# Patient Record
Sex: Male | Born: 1960
Health system: Southern US, Community
[De-identification: ages and names within clinical notes are randomized; demographics above are authoritative.]

## PROBLEM LIST (undated history)

## (undated) DIAGNOSIS — I10 Essential (primary) hypertension: Secondary | ICD-10-CM

## (undated) DIAGNOSIS — E119 Type 2 diabetes mellitus without complications: Principal | ICD-10-CM

## (undated) DIAGNOSIS — E785 Hyperlipidemia, unspecified: Secondary | ICD-10-CM

## (undated) HISTORY — PX: LEG SURGERY: SHX1003

## (undated) HISTORY — DX: Hyperlipidemia, unspecified: E78.5

## (undated) HISTORY — DX: Type 2 diabetes mellitus without complications: E11.9

## (undated) HISTORY — DX: Essential (primary) hypertension: I10

---

## 1999-12-30 ENCOUNTER — Encounter (HOSPITAL_BASED_OUTPATIENT_CLINIC_OR_DEPARTMENT_OTHER): Payer: Self-pay | Admitting: Internal Medicine

## 1999-12-30 ENCOUNTER — Ambulatory Visit (HOSPITAL_COMMUNITY): Admission: RE | Admit: 1999-12-30 | Discharge: 1999-12-30 | Payer: Self-pay | Admitting: Internal Medicine

## 2000-02-14 HISTORY — PX: INGUINAL HERNIA REPAIR: SUR1180

## 2001-04-08 ENCOUNTER — Emergency Department (HOSPITAL_COMMUNITY): Admission: EM | Admit: 2001-04-08 | Discharge: 2001-04-08 | Payer: Self-pay | Admitting: Emergency Medicine

## 2002-04-15 ENCOUNTER — Ambulatory Visit (HOSPITAL_BASED_OUTPATIENT_CLINIC_OR_DEPARTMENT_OTHER): Admission: RE | Admit: 2002-04-15 | Discharge: 2002-04-15 | Payer: Self-pay | Admitting: General Surgery

## 2003-06-08 ENCOUNTER — Encounter: Payer: Self-pay | Admitting: Internal Medicine

## 2004-02-22 ENCOUNTER — Ambulatory Visit: Payer: Self-pay | Admitting: Internal Medicine

## 2004-03-07 ENCOUNTER — Encounter: Admission: RE | Admit: 2004-03-07 | Discharge: 2004-03-07 | Payer: Self-pay | Admitting: Gastroenterology

## 2004-06-27 ENCOUNTER — Ambulatory Visit: Payer: Self-pay | Admitting: Internal Medicine

## 2004-07-04 ENCOUNTER — Encounter: Admission: RE | Admit: 2004-07-04 | Discharge: 2004-07-04 | Payer: Self-pay | Admitting: Internal Medicine

## 2005-02-20 ENCOUNTER — Ambulatory Visit: Payer: Self-pay | Admitting: Internal Medicine

## 2005-02-24 ENCOUNTER — Ambulatory Visit: Payer: Self-pay

## 2005-02-24 ENCOUNTER — Ambulatory Visit: Payer: Self-pay | Admitting: Internal Medicine

## 2005-03-27 ENCOUNTER — Ambulatory Visit: Payer: Self-pay | Admitting: Internal Medicine

## 2006-05-21 ENCOUNTER — Ambulatory Visit: Payer: Self-pay | Admitting: Internal Medicine

## 2006-08-21 ENCOUNTER — Ambulatory Visit: Payer: Self-pay | Admitting: Internal Medicine

## 2006-08-21 ENCOUNTER — Telehealth (INDEPENDENT_AMBULATORY_CARE_PROVIDER_SITE_OTHER): Payer: Self-pay | Admitting: *Deleted

## 2006-08-24 ENCOUNTER — Encounter (INDEPENDENT_AMBULATORY_CARE_PROVIDER_SITE_OTHER): Payer: Self-pay | Admitting: *Deleted

## 2006-08-24 LAB — CONVERTED CEMR LAB
ALT: 38 units/L (ref 0–53)
Creatinine, Ser: 0.6 mg/dL (ref 0.4–1.5)
Creatinine,U: 140.5 mg/dL
Hgb A1c MFr Bld: 6.5 % — ABNORMAL HIGH (ref 4.6–6.0)

## 2006-09-24 ENCOUNTER — Encounter: Payer: Self-pay | Admitting: Internal Medicine

## 2006-12-31 ENCOUNTER — Telehealth (INDEPENDENT_AMBULATORY_CARE_PROVIDER_SITE_OTHER): Payer: Self-pay | Admitting: *Deleted

## 2007-02-12 ENCOUNTER — Ambulatory Visit: Payer: Self-pay | Admitting: Vascular Surgery

## 2007-02-12 ENCOUNTER — Encounter: Payer: Self-pay | Admitting: Internal Medicine

## 2007-02-12 ENCOUNTER — Ambulatory Visit (HOSPITAL_COMMUNITY): Admission: RE | Admit: 2007-02-12 | Discharge: 2007-02-12 | Payer: Self-pay | Admitting: Internal Medicine

## 2007-06-21 ENCOUNTER — Ambulatory Visit: Payer: Self-pay | Admitting: Internal Medicine

## 2007-06-21 DIAGNOSIS — I10 Essential (primary) hypertension: Secondary | ICD-10-CM

## 2007-06-21 HISTORY — DX: Essential (primary) hypertension: I10

## 2007-06-21 LAB — CONVERTED CEMR LAB: HDL goal, serum: 40 mg/dL

## 2007-09-26 ENCOUNTER — Telehealth (INDEPENDENT_AMBULATORY_CARE_PROVIDER_SITE_OTHER): Payer: Self-pay | Admitting: *Deleted

## 2007-10-03 ENCOUNTER — Ambulatory Visit: Payer: Self-pay | Admitting: Internal Medicine

## 2007-10-07 LAB — CONVERTED CEMR LAB
AST: 23 units/L (ref 0–37)
Albumin: 4.4 g/dL (ref 3.5–5.2)
Cholesterol: 165 mg/dL (ref 0–200)
Creatinine,U: 208.1 mg/dL
HDL: 31.3 mg/dL — ABNORMAL LOW (ref >39.0)
Hgb A1c MFr Bld: 6.7 % — ABNORMAL HIGH (ref 4.6–6.0)
Potassium: 4.3 meq/L (ref 3.5–5.1)
Total Protein: 7.9 g/dL (ref 6.0–8.3)
VLDL: 17 mg/dL (ref 0–40)

## 2007-10-11 ENCOUNTER — Ambulatory Visit: Payer: Self-pay | Admitting: Internal Medicine

## 2007-10-11 DIAGNOSIS — E785 Hyperlipidemia, unspecified: Secondary | ICD-10-CM

## 2007-10-11 HISTORY — DX: Hyperlipidemia, unspecified: E78.5

## 2008-10-23 ENCOUNTER — Ambulatory Visit: Payer: Self-pay | Admitting: Internal Medicine

## 2008-10-25 LAB — CONVERTED CEMR LAB
ALT: 48 units/L (ref 0–53)
AST: 24 units/L (ref 0–37)
BUN: 10 mg/dL (ref 6–23)
Bilirubin, Direct: 0 mg/dL (ref 0.0–0.3)
HDL: 32.9 mg/dL — ABNORMAL LOW (ref >39.00)
Microalb Creat Ratio: 2.5 mg/g (ref 0.0–30.0)
Microalb, Ur: 0.5 mg/dL (ref 0.0–1.9)
Potassium: 5.1 meq/L (ref 3.5–5.1)
Total Bilirubin: 0.8 mg/dL (ref 0.3–1.2)
Total CHOL/HDL Ratio: 4

## 2008-10-29 ENCOUNTER — Ambulatory Visit: Payer: Self-pay | Admitting: Internal Medicine

## 2008-10-29 DIAGNOSIS — R9431 Abnormal electrocardiogram [ECG] [EKG]: Secondary | ICD-10-CM | POA: Insufficient documentation

## 2008-10-29 DIAGNOSIS — N400 Enlarged prostate without lower urinary tract symptoms: Secondary | ICD-10-CM | POA: Insufficient documentation

## 2009-02-22 ENCOUNTER — Ambulatory Visit: Payer: Self-pay | Admitting: Internal Medicine

## 2009-02-23 LAB — CONVERTED CEMR LAB
Lymphocytes Relative: 36.7 % (ref 12.0–46.0)
Lymphs Abs: 1.9 10*3/uL (ref 0.7–4.0)
MCHC: 33 g/dL (ref 30.0–36.0)
Monocytes Relative: 11 % (ref 3.0–12.0)
Neutrophils Relative %: 49.8 % (ref 43.0–77.0)
PSA: 0.68 ng/mL (ref 0.10–4.00)
RBC: 4.46 M/uL (ref 4.22–5.81)
RDW: 12.9 % (ref 11.5–14.6)

## 2009-02-24 ENCOUNTER — Encounter (INDEPENDENT_AMBULATORY_CARE_PROVIDER_SITE_OTHER): Payer: Self-pay | Admitting: *Deleted

## 2009-03-01 ENCOUNTER — Ambulatory Visit: Payer: Self-pay | Admitting: Internal Medicine

## 2009-03-01 DIAGNOSIS — E119 Type 2 diabetes mellitus without complications: Secondary | ICD-10-CM | POA: Insufficient documentation

## 2009-03-01 DIAGNOSIS — D649 Anemia, unspecified: Secondary | ICD-10-CM | POA: Insufficient documentation

## 2009-03-01 HISTORY — DX: Type 2 diabetes mellitus without complications: E11.9

## 2009-04-01 ENCOUNTER — Encounter: Payer: Self-pay | Admitting: Internal Medicine

## 2009-05-11 ENCOUNTER — Telehealth (INDEPENDENT_AMBULATORY_CARE_PROVIDER_SITE_OTHER): Payer: Self-pay | Admitting: *Deleted

## 2009-07-21 ENCOUNTER — Ambulatory Visit: Payer: Self-pay | Admitting: Internal Medicine

## 2009-07-21 DIAGNOSIS — T783XXA Angioneurotic edema, initial encounter: Secondary | ICD-10-CM | POA: Insufficient documentation

## 2009-08-18 ENCOUNTER — Telehealth (INDEPENDENT_AMBULATORY_CARE_PROVIDER_SITE_OTHER): Payer: Self-pay | Admitting: *Deleted

## 2009-09-02 ENCOUNTER — Telehealth (INDEPENDENT_AMBULATORY_CARE_PROVIDER_SITE_OTHER): Payer: Self-pay | Admitting: *Deleted

## 2009-11-17 ENCOUNTER — Telehealth (INDEPENDENT_AMBULATORY_CARE_PROVIDER_SITE_OTHER): Payer: Self-pay | Admitting: *Deleted

## 2009-11-30 ENCOUNTER — Telehealth (INDEPENDENT_AMBULATORY_CARE_PROVIDER_SITE_OTHER): Payer: Self-pay | Admitting: *Deleted

## 2009-12-03 ENCOUNTER — Ambulatory Visit: Payer: Self-pay | Admitting: Internal Medicine

## 2010-01-04 ENCOUNTER — Telehealth (INDEPENDENT_AMBULATORY_CARE_PROVIDER_SITE_OTHER): Payer: Self-pay | Admitting: *Deleted

## 2010-01-10 ENCOUNTER — Telehealth (INDEPENDENT_AMBULATORY_CARE_PROVIDER_SITE_OTHER): Payer: Self-pay | Admitting: *Deleted

## 2010-03-02 ENCOUNTER — Encounter: Payer: Self-pay | Admitting: Internal Medicine

## 2010-03-03 ENCOUNTER — Ambulatory Visit
Admission: RE | Admit: 2010-03-03 | Discharge: 2010-03-03 | Payer: Self-pay | Source: Home / Self Care | Attending: Internal Medicine | Admitting: Internal Medicine

## 2010-03-03 ENCOUNTER — Other Ambulatory Visit: Payer: Self-pay | Admitting: Internal Medicine

## 2010-03-03 LAB — LIPID PANEL
Cholesterol: 113 mg/dL (ref 0–200)
HDL: 35.7 mg/dL — ABNORMAL LOW (ref >39.00)
LDL Cholesterol: 68 mg/dL (ref 0–99)
Total CHOL/HDL Ratio: 3
Triglycerides: 45 mg/dL (ref 0.0–149.0)
VLDL: 9 mg/dL (ref 0.0–40.0)

## 2010-03-03 LAB — BUN: BUN: 16 mg/dL (ref 6–23)

## 2010-03-03 LAB — MICROALBUMIN / CREATININE URINE RATIO
Creatinine,U: 85.1 mg/dL
Microalb Creat Ratio: 12.2 mg/g (ref 0.0–30.0)
Microalb, Ur: 10.4 mg/dL — ABNORMAL HIGH (ref 0.0–1.9)

## 2010-03-03 LAB — TSH: TSH: 0.45 u[IU]/mL (ref 0.35–5.50)

## 2010-03-03 LAB — HEPATIC FUNCTION PANEL
ALT: 39 U/L (ref 0–53)
AST: 26 U/L (ref 0–37)
Albumin: 4.7 g/dL (ref 3.5–5.2)
Alkaline Phosphatase: 43 U/L (ref 39–117)
Bilirubin, Direct: 0.1 mg/dL (ref 0.0–0.3)
Total Bilirubin: 0.7 mg/dL (ref 0.3–1.2)
Total Protein: 8 g/dL (ref 6.0–8.3)

## 2010-03-03 LAB — POTASSIUM: Potassium: 4.5 mEq/L (ref 3.5–5.1)

## 2010-03-03 LAB — CREATININE, SERUM: Creatinine, Ser: 0.7 mg/dL (ref 0.4–1.5)

## 2010-03-03 LAB — HEMOGLOBIN A1C: Hgb A1c MFr Bld: 6.6 % — ABNORMAL HIGH (ref 4.6–6.5)

## 2010-03-07 ENCOUNTER — Telehealth (INDEPENDENT_AMBULATORY_CARE_PROVIDER_SITE_OTHER): Payer: Self-pay | Admitting: *Deleted

## 2010-03-15 NOTE — Assessment & Plan Note (Signed)
Summary: bp is high/kn   Vital Signs:  Patient profile:   50 year old male Weight:      191.8 pounds BMI:     29.27 Temp:     98.4 degrees F oral Pulse rate:   72 / minute Resp:     15 per minute BP sitting:   134 / 90  (left arm) Cuff size:   large  Vitals Entered By: Shonna Chock CMA (December 03, 2009 2:50 PM) CC: B/P Concerns   CC:  B/P Concerns.  History of Present Illness: Hypertension Follow-Up      This is a 50 year old man who presents for Hypertension follow-up.  The patient reports minor am  headaches upon awakening , but denies lightheadedness, urinary frequency, and fatigue.  The patient denies the following associated symptoms: chest pain, chest pressure, exercise intolerance, dyspnea, palpitations, syncope, leg edema, and pedal edema.  Compliance with medications (by patient report) has been near 100%.  The patient reports that dietary compliance has been good.  Adjunctive measures currently used by the patient include salt restriction.  BP this am was "129/103"  Current Medications (verified): 1)  Hydrochlorothiazide 25 Mg Tabs (Hydrochlorothiazide) .... 1/2 By Mouth Once Daily 2)  Viagra   Tabs (Sildenafil Citrate Tabs) .... 100mg  Take 1po As Needed 3)  Metformin Hcl 500 Mg  Tabs (Metformin Hcl) .Marland Kitchen.. 1 By Mouth Two Times A Day With 2 Largest Meals 4)  Freestyle Lite Test   Strp (Glucose Blood) .... Use One Strip Daily 5)  Freestyle Lancets   Misc (Lancets) .... Use As Directed 6)  Crestor 20 Mg Tabs (Rosuvastatin Calcium) .... Take 1 Tab Once Daily **appointment Due** 7)  Amlodipine Besylate 5 Mg Tabs (Amlodipine Besylate) .Marland Kitchen.. 1 By Mouth Once Daily **appointment Due** 8)  Januvia 100 Mg Tabs (Sitagliptin Phosphate) .Marland Kitchen.. 1 Once Daily For Diabetes 9)  Ranitidine Hcl 150 Mg Tabs (Ranitidine Hcl) .Marland Kitchen.. 1 Two Times A Day Pre Meals Until Swelling Gone  Allergies: 1)  ! Benazepril Hcl (Benazepril Hcl)  Physical Exam  General:  well-nourished,in no acute distress;  alert,appropriate and cooperative throughout examination Eyes:  No corneal or conjunctival inflammation noted.Perrla. Funduscopic exam benign, without hemorrhages, exudates or papilledema.  Lungs:  Normal respiratory effort, chest expands symmetrically. Lungs are clear to auscultation, no crackles or wheezes. Heart:  Normal rate and regular rhythm. S1 and S2 normal without gallop, murmur, click, rub.S4. BP recheck : 138/ 98 Pulses:  R and L carotid,radial,dorsalis pedis and posterior tibial pulses are full and equal bilaterally Extremities:  No clubbing, cyanosis, edema.   Impression & Recommendations:  Problem # 1:  HYPERTENSION, ESSENTIAL NOS (ICD-401.9)  The following medications were removed from the medication list:    Losartan Potassium 100 Mg Tabs (Losartan potassium) .Marland Kitchen... 1 once daily(PMH of cheek swelling with Benazepril) His updated medication list for this problem includes:    Hydrochlorothiazide 25 Mg Tabs (Hydrochlorothiazide) .Marland Kitchen... 1/2 by mouth once daily    Amlodipine Besylate 5 Mg Tabs (Amlodipine besylate) .Marland Kitchen... 1 by mouth once daily    Carvedilol 3.125 Mg Tabs (Carvedilol) .Marland Kitchen... 1 two times a day ( losaratan faxed is not an option as  benazepril had caused angioedema)  Complete Medication List: 1)  Hydrochlorothiazide 25 Mg Tabs (Hydrochlorothiazide) .... 1/2 by mouth once daily 2)  Viagra Tabs (Sildenafil citrate tabs) .... 100mg  take 1po as needed 3)  Metformin Hcl 500 Mg Tabs (Metformin hcl) .Marland Kitchen.. 1 by mouth two times a day with 2  largest meals 4)  Freestyle Lite Test Strp (Glucose blood) .... Use one strip daily 5)  Freestyle Lancets Misc (Lancets) .... Use as directed 6)  Crestor 20 Mg Tabs (Rosuvastatin calcium) .... Take 1 tab once daily 7)  Amlodipine Besylate 5 Mg Tabs (Amlodipine besylate) .Marland Kitchen.. 1 by mouth once daily 8)  Januvia 100 Mg Tabs (Sitagliptin phosphate) .Marland Kitchen.. 1 once daily for diabetes 9)  Ranitidine Hcl 150 Mg Tabs (Ranitidine hcl) .Marland Kitchen.. 1 two times a  day pre meals until swelling gone 10)  Carvedilol 3.125 Mg Tabs (Carvedilol) .Marland Kitchen.. 1 two times a day ( losaratan faxed is not an option as  benazepril had caused angioedema)  Patient Instructions: 1)   YOU CAN NOT TAKE BP MEDS IN THE ARB OR ACE-I CLASSES BECAUSE OF SWELLING OF CHEEK ON BENAZEPRIL> Please schedule fasting labs: 2)  BUN,creat, K+ :401.9 3)  Hepatic Panel :995.20 4)  Lipid Panel :272.4 5)  TSH:272.4 6)  HbgA1C & Urine Microalbumin : 250.00 7)  Limit your Sodium (Salt) to less than 4 grams a day (slightly less than 1 teaspoon) to prevent fluid retention, swelling, or worsening or symptoms. 8)  Check your Blood Pressure regularly. If it is above: 135/85 ON AVERAGE  you should make an appointment. Prescriptions: CARVEDILOL 3.125 MG TABS (CARVEDILOL) 1 two times a day ( Losaratan Faxed is not an option as  Benazepril had caused angioedema)  #60 x 0   Entered and Authorized by:   Marga Melnick MD   Signed by:   Marga Melnick MD on 12/03/2009   Method used:   Faxed to ...       [facility]* (retail)       [address]       [address] Shipping/mailing       [place], [place]  [address]       Ph: [phone]       Fax: [phone]   RxID:   [id number] LOSARTAN POTASSIUM 100 MG TABS (LOSARTAN POTASSIUM) 1 once daily  #30 x 5   Entered and Authorized by:   Marga Melnick MD   Signed by:   Marga Melnick MD on 12/03/2009   Method used:   Faxed to ...       [facility]* (retail)       [address]       [address] Shipping/mailing       [place], [place]  [address]       Ph: [phone]       Fax: [phone]   RxID:   [id number] AMLODIPINE BESYLATE 5 MG TABS (AMLODIPINE BESYLATE) 1 by mouth once daily  #90 x 1   Entered and Authorized by:   Marga Melnick MD   Signed by:   Marga Melnick MD on 12/03/2009   Method used:   Faxed to ...       [facility]* (retail)       [address]       [address] Shipping/mailing       [place], [place]  [address]       Ph: [phone]       Fax: [phone]   RxID:   [id number]    Orders Added: 1)  Est. Patient Level III [[address]]  Appended Document: bp is high/kn I called [facility] and cancelled rx for Losartan per Dr.Hopper

## 2010-03-15 NOTE — Progress Notes (Signed)
SummaryAlma Newman REFILL  Phone Note Refill Request Message from:  Fax from Pharmacy on November 30, 2009 11:04 AM  Refills Requested: Medication #1:  JANUVIA 100 MG TABS 1 once daily for Diabetes   Last Refilled: 09/02/2009 [facility]   FA [phone]  Initial call taken by: Jerolyn Shin,  November 30, 2009 11:08 AM    Prescriptions: JANUVIA 100 MG TABS (SITAGLIPTIN PHOSPHATE) 1 once daily for Diabetes  #90 x 0   Entered by:   Shonna Chock CMA   Authorized by:   Marga Melnick MD   Signed by:   Shonna Chock CMA on 11/30/2009   Method used:   Electronically to        [facility]* (retail)       [address]       [address] [place] Shipping/mailing       [place], [place]  [address]       Ph: [phone]       Fax: [phone]   RxID:   [id number]

## 2010-03-15 NOTE — Letter (Signed)
Summary: Geralynn Rile  [facility]   Imported By: Lanelle Bal 04/06/2009 11:32:04  _____________________________________________________________________  External Attachment:    Type:   Image     Comment:   External Document

## 2010-03-15 NOTE — Progress Notes (Signed)
Summary: refill  Phone Note Refill Request Message from:  Fax from Pharmacy on November 17, 2009 4:46 PM  Refills Requested: Medication #1:  CRESTOR 20 MG TABS Take 1 tab once daily  Medication #2:  AMLODIPINE BESYLATE 5 MG TABS 1 qd [facility] - fax [phone]   Initial call taken by: Okey Regal Spring,  November 17, 2009 4:46 PM    New/Updated Medications: CRESTOR 20 MG TABS (ROSUVASTATIN CALCIUM) Take 1 tab once daily **APPOINTMENT DUE** AMLODIPINE BESYLATE 5 MG TABS (AMLODIPINE BESYLATE) 1 by mouth once daily **APPOINTMENT DUE** Prescriptions: AMLODIPINE BESYLATE 5 MG TABS (AMLODIPINE BESYLATE) 1 by mouth once daily **APPOINTMENT DUE**  #30 x 1   Entered by:   Shonna Chock CMA   Authorized by:   Marga Melnick MD   Signed by:   Shonna Chock CMA on 11/18/2009   Method used:   Electronically to        [facility]* (retail)       [address]       [address] [place] Shipping/mailing       [place], [place]  [address]       Ph: [phone]       Fax: [phone]   RxID:   [id number] CRESTOR 20 MG TABS (ROSUVASTATIN CALCIUM) Take 1 tab once daily **APPOINTMENT DUE**  #30 x 1   Entered by:   Shonna Chock CMA   Authorized by:   Marga Melnick MD   Signed by:   Shonna Chock CMA on 11/18/2009   Method used:   Electronically to        [facility]* (retail)       [address]       [address] [place] Shipping/mailing       [place], [place]  [address]       Ph: [phone]       Fax: [phone]   RxID:   [id number]

## 2010-03-15 NOTE — Assessment & Plan Note (Signed)
Summary: face swelling/cbs   Vital Signs:  Patient profile:   50 year old male Weight:      190.8 pounds Temp:     98.5 degrees F oral Pulse rate:   80 / minute Resp:     16 per minute BP sitting:   124 / 76  (left arm) Cuff size:   large  Vitals Entered By: Shonna Chock (July 21, 2009 3:21 PM) CC: Face swelling x couple days, no change in daily habits Comments REVIEWED MED LIST, PATIENT AGREED DOSE AND INSTRUCTION CORRECT    CC:  Face swelling x couple days and no change in daily habits.  History of Present Illness: R maxillary swelling X 8 days ;this was present upon arising. The swelling is unchanged ;  no rash ,skin  color or temperature change & no significant  pain. No signs/ symptoms of URI. Rx: none. He is on an ACE-I. He has no PMH of allergies.  Allergies (verified): 1)  ! Benazepril Hcl (Benazepril Hcl)  Review of Systems General:  Denies chills, fever, and sweats. Eyes:  Denies discharge, eye pain, and red eye. ENT:  Denies ear discharge, earache, nasal congestion, and sinus pressure; Minor discomfort R paranasal area. No frontal headache or purulence.Marland Kitchen Resp:  Denies cough, sputum productive, and wheezing; No PMH of asthma. Derm:  Denies flushing and itching. Allergy:  Denies hives or rash, itching eyes, and sneezing; No lip or tongue swelling.  Physical Exam  General:  well-nourished,in no acute distress; alert,appropriate and cooperative throughout examination Eyes:  No corneal or conjunctival inflammation noted. EOMI. Perrla.  Vision grossly normal. Ears:  External ear exam shows no significant lesions or deformities.  Otoscopic examination reveals clear canals, tympanic membranes are intact bilaterally without bulging, retraction, inflammation or discharge. Hearing is grossly normal bilaterally. Nose:  External nasal examination shows no deformity or inflammation. Nasal mucosa are pink and moist without lesions or exudates. Mouth:  Oral mucosa and oropharynx  without lesions or exudates.  Teeth in good repair. Oropharynx WNL  Neck:  No UAO with hyperventilation Lungs:  Normal respiratory effort, chest expands symmetrically. Lungs are clear to auscultation, no crackles or wheezes. Skin:  4X1.5 cm doughy edema of R paranasal fold Cervical Nodes:  No lymphadenopathy noted Axillary Nodes:  No palpable lymphadenopathy   Impression & Recommendations:  Problem # 1:  ANGIOEDEMA (ICD-995.1) probably due to Benazepril   Problem # 2:  HYPERTENSION, ESSENTIAL NOS (ICD-401.9) BP monitor essential  off Benazepril The following medications were removed from the medication list:    Benazepril Hcl 40 Mg Tabs (Benazepril hcl) .Marland Kitchen... 1 tab once daily, must have office visit for additional refills His updated medication list for this problem includes:    Hydrochlorothiazide 25 Mg Tabs (Hydrochlorothiazide) .Marland Kitchen... 1/2 by mouth once daily    Amlodipine Besylate 5 Mg Tabs (Amlodipine besylate) .Marland Kitchen... 1 qd  Complete Medication List: 1)  Hydrochlorothiazide 25 Mg Tabs (Hydrochlorothiazide) .... 1/2 by mouth once daily 2)  Viagra Tabs (Sildenafil citrate tabs) .... 100mg  take 1po as needed 3)  Metformin Hcl 500 Mg Tabs (Metformin hcl) .Marland Kitchen.. 1 by mouth two times a day with 2 largest meals 4)  Freestyle Lite Test Strp (Glucose blood) .... Use one strip daily 5)  Freestyle Lancets Misc (Lancets) .... Use as directed 6)  Simvastatin 40 Mg Tabs (Simvastatin) .Marland Kitchen.. 1 at bedtime-office visit and labs due 7)  Amlodipine Besylate 5 Mg Tabs (Amlodipine besylate) .Marland Kitchen.. 1 qd 8)  Januvia 100 Mg Tabs (  Sitagliptin phosphate) .Marland Kitchen.. 1 once daily for diabetes 9)  Fexofenadine Hcl 180 Mg Tabs (Fexofenadine hcl) .Marland Kitchen.. 1 once daily until swelling gone 10)  Ranitidine Hcl 150 Mg Tabs (Ranitidine hcl) .Marland Kitchen.. 1 two times a day pre meals until swelling gone  Patient Instructions: 1)  Check your Blood Pressure regularly. If it is above: 140/90 ON AVERAGE OFF Benazepril 40 mg  you should make an  appointment. Call if having facial pain, purulence  or fever. Prescriptions: RANITIDINE HCL 150 MG TABS (RANITIDINE HCL) 1 two times a day pre meals until swelling gone  #60 x 0   Entered and Authorized by:   Marga Melnick MD   Signed by:   Marga Melnick MD on 07/21/2009   Method used:   Faxed to ...       [facility]* (retail)       [address]       [address] Shipping/mailing       [place], [place]  [address]       Ph: [phone]       Fax: [phone]   RxID:   [id number] FEXOFENADINE HCL 180 MG TABS (FEXOFENADINE HCL) 1 once daily until swelling gone  #30 x 0   Entered and Authorized by:   Marga Melnick MD   Signed by:   Marga Melnick MD on 07/21/2009   Method used:   Faxed to ...       [facility]* (retail)       [address]       [address] Shipping/mailing       [place], [place]  [address]       Ph: [phone]       Fax: [phone]   RxID:   [id number]

## 2010-03-15 NOTE — Progress Notes (Signed)
Summary: refill carvedilol  Phone Note Refill Request Message from:  Fax from Pharmacy on January 04, 2010 3:06 PM  Refills Requested: Medication #1:  CARVEDILOL 3.125 MG TABS 1 two times a day ( Losaratan Faxed is not an option as  Benazepril had caused angioedema). [facility] - fax [phone]  Initial call taken by: Okey Regal Spring,  January 04, 2010 3:09 PM    Prescriptions: CARVEDILOL 3.125 MG TABS (CARVEDILOL) 1 two times a day ( Losaratan Faxed is not an option as  Benazepril had caused angioedema)  #60 x 5   Entered by:   Shonna Chock CMA   Authorized by:   Marga Melnick MD   Signed by:   Shonna Chock CMA on 01/04/2010   Method used:   Electronically to        [facility]* (retail)       [address]       [address] [place] Shipping/mailing       [place], [place]  [address]       Ph: [phone]       Fax: [phone]   RxID:   [id number]

## 2010-03-15 NOTE — Progress Notes (Signed)
Summary: refill  Phone Note Refill Request Message from:  Fax from Pharmacy on January 10, 2010 4:14 PM  Refills Requested: Medication #1:  METFORMIN HCL 500 MG  TABS 1 by mouth two times a day with 2 largest meals  Medication #2:  RANITIDINE HCL 150 MG TABS 1 two times a day pre meals until swelling gone  Medication #3:  CRESTOR 20 MG TABS Take 1 tab once daily  Medication #4:  JANUVIA 100 MG TABS 1 once daily for Diabetes [facility] -fax [phone]  Initial call taken by: Okey Regal Spring,  January 10, 2010 4:21 PM  Follow-up for Phone Call        tried calling no answer no machine per labs done in 02/22/09 was supposed to return in 6 months past due for labs will send in 30 days w/ note for patient to schedule labs..........Marland KitchenDoristine Devoid CMA  January 11, 2010 3:02 PM     New/Updated Medications: METFORMIN HCL 500 MG  TABS (METFORMIN HCL) 1 by mouth two times a day with 2 largest meals- DUE FOR FASTING LABS BEFORE FURTHER REFILLS CRESTOR 20 MG TABS (ROSUVASTATIN CALCIUM) Take 1 tab once daily- DUE FOR FASTING LABS JANUVIA 100 MG TABS (SITAGLIPTIN PHOSPHATE) 1 once daily for Diabetes- DUE FOR FASTING LABS RANITIDINE HCL 150 MG TABS (RANITIDINE HCL) 1 two times a day pre meals until swelling gone- Prescriptions: RANITIDINE HCL 150 MG TABS (RANITIDINE HCL) 1 two times a day pre meals until swelling gone-  #180 x 0   Entered by:   Doristine Devoid CMA   Authorized by:   Marga Melnick MD   Signed by:   Doristine Devoid CMA on 01/11/2010   Method used:   Electronically to        [facility]* (retail)       [address]       [address] [place] Shipping/mailing       [place], [place]  [address]       Ph: [phone]       Fax: [phone]   RxID:   [id number] JANUVIA 100 MG TABS (SITAGLIPTIN PHOSPHATE) 1 once daily for Diabetes- DUE FOR FASTING LABS  #30 x 0   Entered by:   Doristine Devoid CMA   Authorized by:   Marga Melnick MD   Signed by:   Doristine Devoid  CMA on 01/11/2010   Method used:   Electronically to        [facility]* (retail)       [address]       [address] [place] Shipping/mailing       [place], [place]  [address]       Ph: [phone]       Fax: [phone]   RxID:   [id number] CRESTOR 20 MG TABS (ROSUVASTATIN CALCIUM) Take 1 tab once daily- DUE FOR FASTING LABS  #30 x 0   Entered by:   Doristine Devoid CMA   Authorized by:   Marga Melnick MD   Signed by:   Doristine Devoid CMA on 01/11/2010   Method used:   Electronically to        [facility]* (retail)       [address]       [address] [place] Shipping/mailing       [place], [place]  [address]       Ph: [phone]       Fax: [phone]  RxID:   [id number] METFORMIN HCL 500 MG  TABS (METFORMIN HCL) 1 by mouth two times a day with 2 largest meals- DUE FOR FASTING LABS BEFORE FURTHER REFILLS  #60 x 0   Entered by:   Doristine Devoid CMA   Authorized by:   Marga Melnick MD   Signed by:   Doristine Devoid CMA on 01/11/2010   Method used:   Electronically to        [facility]* (retail)       [address]       [address] [place] Shipping/mailing       [place], [place]  [address]       Ph: [phone]       Fax: [phone]   RxID:   [id number]

## 2010-03-15 NOTE — Letter (Signed)
Summary: Results Follow up Letter  [facility]  [address] [place], [place] [address]   Phone: [phone]  Fax: [phone]    02/24/2009 MRN: [id number]  Todd Newman [address] [place], [place]  [address]  Dear [name],  The following are the results of your recent test(s):  Test         Result    Pap Smear:        Normal _____  Not Normal _____ Comments: ______________________________________________________ Cholesterol: LDL(Bad cholesterol):         Your goal is less than:         HDL (Good cholesterol):       Your goal is more than: Comments:  ______________________________________________________ Mammogram:        Normal _____  Not Normal _____ Comments:  ___________________________________________________________________ Hemoccult:        Normal _____  Not normal _______ Comments:    _____________________________________________________________________ Other Tests: Please see attached lab done on 02/22/2009    We routinely do not discuss normal results over the telephone.  If you desire a copy of the results, or you have any questions about this information we can discuss them at your next office visit.   Sincerely,

## 2010-03-15 NOTE — Progress Notes (Signed)
Summary: med interaction  Phone Note From Pharmacy   Summary of Call: pt currently receiving simvastatin 40 mg along with amlodipine 5 mg. please see following page concerning current guidelines/dosing with the concurrent use of theses. per dr hopper change to crestor 20 mg once daily. pharmacy notified.Marland KitchenMarland KitchenFelecia Deloach CMA  August 18, 2009 5:04 PM     New/Updated Medications: CRESTOR 20 MG TABS (ROSUVASTATIN CALCIUM) Take 1 tab once daily Prescriptions: CRESTOR 20 MG TABS (ROSUVASTATIN CALCIUM) Take 1 tab once daily  #90 x 0   Entered by:   Jeremy Johann CMA   Authorized by:   Marga Melnick MD   Signed by:   Jeremy Johann CMA on 08/18/2009   Method used:   Faxed to ...       [facility]* (retail)       [address]       [address] Shipping/mailing       [place], [place]  [address]       Ph: [phone]       Fax: [phone]   RxID:   [id number]

## 2010-03-15 NOTE — Progress Notes (Signed)
Summary: refill  Phone Note Refill Request Message from:  Fax from Pharmacy on May 11, 2009 4:11 PM  Refills Requested: Medication #1:  BENAZEPRIL HCL 40 MG  TABS 1 tab once daily [place] outpatient fax [phone]   Method Requested: Fax to Local Pharmacy Next Appointment Scheduled: no appt Initial call taken by: Barb Merino,  May 11, 2009 4:11 PM    Prescriptions: BENAZEPRIL HCL 40 MG  TABS (BENAZEPRIL HCL) 1 tab once daily, MUST HAVE OFFICE VISIT FOR ADDITIONAL REFILLS  #90 x 1   Entered by:   Shonna Chock   Authorized by:   Marga Melnick MD   Signed by:   Shonna Chock on 05/11/2009   Method used:   Electronically to        [facility]* (retail)       [address]       [address] [place] Shipping/mailing       [place], [place]  [address]       Ph: [phone]       Fax: [phone]   RxID:   [id number]

## 2010-03-15 NOTE — Assessment & Plan Note (Signed)
Summary: 4 mth fu/ns/kdc   Vital Signs:  Patient profile:   50 year old male Weight:      196.6 pounds Pulse rate:   76 / minute Resp:     15 per minute BP sitting:   130 / 88  (left arm) Cuff size:   large  Vitals Entered By: Shonna Chock (March 01, 2009 4:38 PM) CC: 4 month follow-up  Comments REVIEWED MED LIST, PATIENT AGREED DOSE AND INSTRUCTION CORRECT    CC:  4 month follow-up .  History of Present Illness: "Eating better" ; FBS 130s; 2 hrs post meal ? < 140. No hypoglycemia. Weight down ? 5#. No Ophth exam in > 12 months. A1c down from 6.8 to 6.4%. Labs reviewed & risks discussed. BP not monitored regularly.  Allergies (verified): No Known Drug Allergies  Review of Systems General:  Denies fatigue. Eyes:  Complains of blurring; denies double vision and vision loss-both eyes. CV:  Denies chest pain or discomfort, leg cramps with exertion, lightheadness, and near fainting. Derm:  Denies poor wound healing. Neuro:  Denies numbness and tingling. Endo:  Denies excessive hunger, excessive thirst, and excessive urination.  Physical Exam  General:  well-nourished,in no acute distress; alert,appropriate and cooperative throughout examination Lungs:  Normal respiratory effort, chest expands symmetrically.  Heart:  Normal rate and regular rhythm. S1 and S2 normal without gallop, murmur, click, rub. S4 Pulses:  R and L carotid,radial,dorsalis pedis  pulses are full and equal bilaterally. Decreased PTO Extremities:  No clubbing, cyanosis, edema. Pes planus. Good nail health Neurologic:  sensation intact to light touch over feet.   Skin:  Intact without suspicious lesions or rashes   Impression & Recommendations:  Problem # 1:  DIABETES MELLITUS, CONTROLLED (ICD-250.00)  His updated medication list for this problem includes:    Benazepril Hcl 40 Mg Tabs (Benazepril hcl) .Marland Kitchen... 1 tab once daily, must have office visit for additional refills    Metformin Hcl 500 Mg Tabs  (Metformin hcl) .Marland Kitchen... 1 by mouth two times a day with 2 largest meals    Januvia 100 Mg Tabs (Sitagliptin phosphate) .Marland Kitchen... 1 once daily for diabetes  Problem # 2:  ANEMIA (ICD-285.9) resolved   Complete Medication List: 1)  Hydrochlorothiazide 25 Mg Tabs (Hydrochlorothiazide) .... 1/2 by mouth once daily 2)  Viagra Tabs (Sildenafil citrate tabs) .... 100mg  take 1po as needed 3)  Benazepril Hcl 40 Mg Tabs (Benazepril hcl) .Marland Kitchen.. 1 tab once daily, must have office visit for additional refills 4)  Metformin Hcl 500 Mg Tabs (Metformin hcl) .Marland Kitchen.. 1 by mouth two times a day with 2 largest meals 5)  Freestyle Lite Test Strp (Glucose blood) .... Use one strip daily 6)  Freestyle Lancets Misc (Lancets) .... Use as directed 7)  Simvastatin 40 Mg Tabs (Simvastatin) .Marland Kitchen.. 1 at bedtime-office visit and labs due 8)  Amlodipine Besylate 5 Mg Tabs (Amlodipine besylate) .Marland Kitchen.. 1 qd 9)  Januvia 100 Mg Tabs (Sitagliptin phosphate) .Marland Kitchen.. 1 once daily for diabetes  Patient Instructions: 1)  Check your blood sugars regularly. If your readings are usually above :150 or below 90 you should contact our office. 2)  See your eye doctor yearly to check for diabetic eye damage. 3)  Check your feet each night for sore areas, calluses or signs of infection.Eat & drink LESS THAN 40 grams of sugar/ day from LABELED foods & drinks.Check your Blood Pressure regularly. If it is above:135/[address]  you should make an appointment. 4)  Please  schedule a follow-up appointment in 6-8  months. 5)  BUN,creat, K+ prior to visit, ICD-9: 6)  Hepatic Panel prior to visit, ICD-9: 7)  Lipid Panel prior to visit, ICD-9: 8)  HbgA1C prior to visit, ICD-9:

## 2010-03-15 NOTE — Progress Notes (Signed)
Summary: Refill Request  Phone Note Refill Request Call back at [phone] Message from:  Pharmacy on September 02, 2009 10:11 AM  Refills Requested: Medication #1:  JANUVIA 100 MG TABS 1 once daily for Diabetes   Dosage confirmed as above?Dosage Confirmed   Supply Requested: 1 month   Last Refilled: 08/02/2009  Medication #2:  RANITIDINE HCL 150 MG TABS 1 two times a day pre meals until swelling gone.   Dosage confirmed as above?Dosage Confirmed   Supply Requested: 1 month   Last Refilled: 07/22/2009  Medication #3:  METFORMIN HCL 500 MG  TABS 1 by mouth two times a day with 2 largest meals   Dosage confirmed as above?Dosage Confirmed   Supply Requested: 3 months   Last Refilled: 06/29/2009 [facility]  Next Appointment Scheduled: none Initial call taken by: Lavell Islam,  September 02, 2009 10:13 AM    Prescriptions: RANITIDINE HCL 150 MG TABS (RANITIDINE HCL) 1 two times a day pre meals until swelling gone  #180 x 0   Entered by:   Doristine Devoid CMA   Authorized by:   Marga Melnick MD   Signed by:   Doristine Devoid CMA on 09/02/2009   Method used:   Electronically to        [facility]* (retail)       [address]       [address] [employer]       [place], [place]  [address]       Ph: [phone]       Fax: [phone]   RxID:   [id number] JANUVIA 100 MG TABS (SITAGLIPTIN PHOSPHATE) 1 once daily for Diabetes  #90 x 0   Entered by:   Doristine Devoid CMA   Authorized by:   Marga Melnick MD   Signed by:   Doristine Devoid CMA on 09/02/2009   Method used:   Electronically to        [facility]* (retail)       [address]       [address] [place] [employer]       [place], [place]  [address]       Ph: [phone]       Fax: [phone]   RxID:   [id number] METFORMIN HCL 500 MG  TABS (METFORMIN HCL) 1 by mouth two times a day with 2 largest meals  #180 Tablet x 0   Entered by:   Doristine Devoid CMA  Authorized by:   Marga Melnick MD   Signed by:   Doristine Devoid CMA on 09/02/2009   Method used:   Electronically to        [facility]* (retail)       [address]       [address] [place] [employer]       [place], [place]  [address]       Ph: [phone]       Fax: [phone]   RxID:   [id number]

## 2010-03-17 NOTE — Progress Notes (Signed)
Summary: New Rx  Phone Note From Pharmacy   Caller: [facility] Summary of Call: Handwritten note:  Can we get a new prescription for the True Result test stripes and lancets? Patient can get the True Strips and lancets for zero copay at our pharmacy.   [phone]: [phone] f: [phone] Initial call taken by: Harold Barban,  March 07, 2010 9:36 AM  Follow-up for Phone Call        RX for januvia and glocometer faxed [phone] Follow-up by: Shonna Chock CMA,  March 08, 2010 9:27 AM    New/Updated Medications: JANUVIA 100 MG TABS (SITAGLIPTIN PHOSPHATE) 1 once daily for Diabetes TRUERESULT BLOOD GLUCOSE W/DEVICE KIT (BLOOD GLUCOSE MONITORING SUPPL) Check bloodsugar daily, DX:250.00 (Include Test Strips,Lancets, & Alcohol preps) Prescriptions: JANUVIA 100 MG TABS (SITAGLIPTIN PHOSPHATE) 1 once daily for Diabetes  #90 x 0   Entered by:   Shonna Chock CMA   Authorized by:   Marga Melnick MD   Signed by:   Shonna Chock CMA on 03/08/2010   Method used:   Print then Give to Patient   RxID:   [id number] TRUERESULT BLOOD GLUCOSE W/DEVICE KIT (BLOOD GLUCOSE MONITORING SUPPL) Check bloodsugar daily, DX:250.00 (Include Test Strips,Lancets, & Alcohol preps)  #70mo supp x 3   Entered by:   Shonna Chock CMA   Authorized by:   Marga Melnick MD   Signed by:   Shonna Chock CMA on 03/08/2010   Method used:   Print then Give to Patient   RxID:   [id number]

## 2010-03-18 ENCOUNTER — Telehealth (INDEPENDENT_AMBULATORY_CARE_PROVIDER_SITE_OTHER): Payer: Self-pay | Admitting: *Deleted

## 2010-03-23 NOTE — Letter (Signed)
Summary: [facility]  [facility]   Imported By: Lanelle Bal 03/15/2010 12:29:23  _____________________________________________________________________  External Attachment:    Type:   Image     Comment:   External Document

## 2010-03-23 NOTE — Progress Notes (Signed)
Summary: Refill Request  Phone Note Refill Request Call back at [phone] Message from:  Pharmacy on March 18, 2010 1:43 PM  Refills Requested: Medication #1:  METFORMIN HCL 500 MG  TABS 1 by mouth two times a day with 2 largest meals- DUE FOR FASTING LABS BEFORE FURTHER REFILLS   Dosage confirmed as above?Dosage Confirmed   Supply Requested: 60   Last Refilled: 02/15/2010 [facility]  Next Appointment Scheduled: none Initial call taken by: Harold Barban,  March 18, 2010 1:44 PM  Follow-up for Phone Call        Patient followed by: [facility] DM management program; Follow-up by: Shonna Chock CMA,  March 18, 2010 2:33 PM    New/Updated Medications: METFORMIN HCL 500 MG  TABS (METFORMIN HCL) 1 by mouth two times a day with 2 largest meals Prescriptions: METFORMIN HCL 500 MG  TABS (METFORMIN HCL) 1 by mouth two times a day with 2 largest meals  #60 x 0   Entered by:   Shonna Chock CMA   Authorized by:   Marga Melnick MD   Signed by:   Shonna Chock CMA on 03/18/2010   Method used:   Electronically to        [facility]* (retail)       [address]       [address] [employer] [employer]       [place], [place]  [address]       Ph: [phone]       Fax: [phone]   RxID:   [id number]

## 2010-03-29 ENCOUNTER — Encounter: Payer: Self-pay | Admitting: Internal Medicine

## 2010-03-30 ENCOUNTER — Telehealth (INDEPENDENT_AMBULATORY_CARE_PROVIDER_SITE_OTHER): Payer: Self-pay | Admitting: *Deleted

## 2010-04-04 ENCOUNTER — Encounter: Payer: Self-pay | Admitting: Internal Medicine

## 2010-04-04 ENCOUNTER — Encounter: Payer: Commercial Managed Care - PPO | Attending: Internal Medicine

## 2010-04-06 NOTE — Progress Notes (Signed)
Summary: refill  Phone Note Refill Request Message from:  Fax from Pharmacy on March 30, 2010 3:57 PM  Refills Requested: Medication #1:  VIAGRA   TABS 100MG  TAKE 1PO as needed [facility] - fax [phone]  Initial call taken by: Okey Regal Spring,  March 30, 2010 3:57 PM    Prescriptions: VIAGRA   TABS (SILDENAFIL CITRATE TABS) 100MG  TAKE 1PO as needed  #6 x 1   Entered by:   Shonna Chock CMA   Authorized by:   Marga Melnick MD   Signed by:   Shonna Chock CMA on 03/31/2010   Method used:   Faxed to ...       [facility]* (retail)       [address]       [place], [place]  [address]       Ph: [phone]       Fax: [phone]   RxID:   [id number]

## 2010-04-12 NOTE — Letter (Signed)
Summary: Note from Cranford Mon, RN  Note from Cranford Mon, RN   Imported By: Maryln Gottron 04/06/2010 11:13:12  _____________________________________________________________________  External Attachment:    Type:   Image     Comment:   External Document

## 2010-04-21 ENCOUNTER — Telehealth (INDEPENDENT_AMBULATORY_CARE_PROVIDER_SITE_OTHER): Payer: Self-pay | Admitting: *Deleted

## 2010-04-21 NOTE — Letter (Signed)
Summary: [facility]  [facility]   Imported By: Maryln Gottron 04/12/2010 12:54:48  _____________________________________________________________________  External Attachment:    Type:   Image     Comment:   External Document

## 2010-04-25 ENCOUNTER — Encounter: Payer: Self-pay | Admitting: Internal Medicine

## 2010-04-26 ENCOUNTER — Encounter: Payer: Commercial Managed Care - PPO | Attending: Internal Medicine

## 2010-05-03 NOTE — Progress Notes (Signed)
Summary: refill   Phone Note From Pharmacy Call back at [phone]   Summary of Call: steve from [facility] called regarding denial for metformin - he said they provide counseling & diet management - but they still need a prescriber for med - he said he called dr Holley Bouche but he doesnt prescribe -  Initial call taken by: Okey Regal Spring,  April 21, 2010 4:23 PM  Follow-up for Phone Call        Dr.Hopper please advise, Im not sure when patient is to follow-up with you to recheck a1c. Per Note from 03/29/2010 patient has no problem following up with you  Follow-up by: Shonna Chock CMA,  April 21, 2010 4:40 PM  Additional Follow-up for Phone Call Additional follow up Details #1::        refill Metformin  X 30 days; appt before next refill in 05/2010 with glucose  readings.He'll be seen every 6 months; lat seen 10/11 Additional Follow-up by: Marga Melnick MD,  April 21, 2010 5:11 PM    Additional Follow-up for Phone Call Additional follow up Details #2::    Left message with male to have patient return call when avaliable to schedule appointment for 05/2010 for a follow-up./[facility] CMA  April 25, 2010 1:11 PM   Prescriptions: METFORMIN HCL 500 MG  TABS (METFORMIN HCL) 1 by mouth two times a day with 2 largest meals  #60 x 0   Entered by:   Shonna Chock CMA   Authorized by:   Marga Melnick MD   Signed by:   Shonna Chock CMA on 04/22/2010   Method used:   Electronically to        [facility]* (retail)       [address]       [address] [place] [employer]       [place], [place]  [address]       Ph: [phone]       Fax: [phone]   RxID:   [id number]

## 2010-05-12 NOTE — Medication Information (Signed)
Summary: New RX Request for Blood Glucose Testing Strips  New RX Request for Blood Glucose Testing Strips   Imported By: Maryln Gottron 05/02/2010 10:32:28  _____________________________________________________________________  External Attachment:    Type:   Image     Comment:   External Document

## 2010-05-14 ENCOUNTER — Encounter: Payer: Self-pay | Admitting: Internal Medicine

## 2010-05-16 ENCOUNTER — Encounter: Payer: Self-pay | Admitting: Internal Medicine

## 2010-05-16 ENCOUNTER — Ambulatory Visit (INDEPENDENT_AMBULATORY_CARE_PROVIDER_SITE_OTHER): Payer: Commercial Managed Care - PPO | Admitting: Internal Medicine

## 2010-05-16 VITALS — BP 130/82 | HR 60 | Wt 183.4 lb

## 2010-05-16 DIAGNOSIS — E119 Type 2 diabetes mellitus without complications: Secondary | ICD-10-CM

## 2010-05-16 NOTE — Progress Notes (Signed)
  Subjective:    Patient ID: Todd Newman, male    DOB: 06-01-1960, 50 y.o.   MRN: [id number]  HPI  The following questions were asked concerning diabetes status. Have  you had excess thirst, excess hunger or excess urination. Have you had lightheadedness with standing, ,chest pain ; palpitations; or pain in  calves with walking.Are there any new ulcers or sores on the skin which are nonhealing especially over the feet. Has there  been a significant change in  weight. Are you  exercising. What is the value of your fasting sugar in the morning before eating ; what was the highest sugar 2 hours after a meal. Do you have numbness or tingling or burning in your feet.  He does go to the gym 3 times a week; he's lost 12 pounds.  He states that his morning sugars have ranged from 80-114. In reviewing the machine values as low as 77 were noted and a high of 150 on one occasion. Overall the numbers look quite good.        Review of Systems     Objective:   Physical Exam Heart:  Normal rate and regular rhythm. S1 and S2 normal without gallop, murmur, click, rub or other extra sounds.                                                                                                      Lungs:Chest clear to auscultation; no wheezes, rhonchi,rales ,or rubs present.No increased work of breathing. Diabetic Foot Check -  Appearance - no lesions, ulcers or calluses; good nail health Skin - no unusual pallor or redness Sensation - grossly intact to light touch Pulses: Left - Dorsalis Pedis and Posterior Tibia normal Right - Dorsalis Pedis and Posterior Tibia normal        Assessment & Plan:  #1 diabetes with adequate control; he is very focused and motivated  Plan: No change will be made in medications.; A1c will be monitored as per protocol(see patient Instructions)

## 2010-05-16 NOTE — Patient Instructions (Signed)
Diabetes Monitor   The A1c test checks the average amount of glucose (sugar) in the blood over the last 2 to 3 months.As glucose circulates in the blood, some of it binds to hemoglobin A. This is the main form of hemoglobin in adults. Hemoglobin is a red protein that carries oxygen in the red blood cells (RBC's). Once the glucose is bound to the hemoglobin A, it remains there for the life of the red blood cell (about 120 days). This combination of glucose and hemoglobin A is called A1c (or hemoglobin A1c or glycohemoglobin). Increased glucose in the blood, increases the hemoglobin A1c. A1c levels do not change quickly but will shift as older RBC's die and younger ones take their place.   The A1c test is used primarily to monitor the glucose control of diabetics over time. The goal of those with diabetes is to keep their blood glucose levels as close to normal as possible. This helps to minimize the complications caused by chronically elevated glucose levels, such as progressive damage to body organs like the kidneys, eyes, cardiovascular system, and nerves. The A1c test gives a picture of the average amount of glucose in the blood over the last few months. It can help a patient and his doctor know if the measures they are taking to control the patient's diabetes are successful or need to be adjusted.     Depending on the type of diabetes that you have, how well your diabetes is controlled, your A1c may be measured 2 to 4 times each year. When someone is first diagnosed with diabetes or if control is not good, A1c may be ordered more frequently.   NORMAL VALUES  Non diabetic adults: 5 %-6.1%  Good diabetic control: 6.2-6.4 %  Fair diabetic control: 6.5-7%  Poor diabetic control: greater than 7 % ( except with additional factors such as  advanced age; significant coronary or neurologic disease,etc). Check the A1c every 6 months if it is < 6.5%; every 4 months if  6.5% or higher. Goals for home glucose  monitoring are : fasting  or morning glucose goal of  90-150. Two hours after any meal , goal = < 180, preferably < 150.       

## 2010-05-24 ENCOUNTER — Other Ambulatory Visit: Payer: Self-pay | Admitting: Internal Medicine

## 2010-07-01 NOTE — Op Note (Signed)
Todd Newman, Todd Newman                         ACCOUNT NO.:  [id number]   MEDICAL RECORD NO.:  [id number]                   PATIENT TYPE:  AMB   LOCATION:  DSC                                  FACILITY:  [facility]   PHYSICIAN:  Sharlet Salina T. Hoxworth, M.D.          DATE OF BIRTH:  1960-11-15   DATE OF PROCEDURE:  04/15/2002  DATE OF DISCHARGE:                                 OPERATIVE REPORT   PREOPERATIVE DIAGNOSIS:  Right inguinal hernia.   POSTOPERATIVE DIAGNOSIS:  Right inguinal hernia.   OPERATION PERFORMED:  Repair of right inguinal hernia.   SURGEON:  Lorne Skeens. Hoxworth, M.D.   ANESTHESIA:  General.   INDICATIONS FOR PROCEDURE:  The patient is a 50 year old Hispanic male who  presents with right groin pain and on exam has a right inguinal hernia.  Repair under general anesthesia using mesh has been recommended and  accepted.  The nature of the procedure, its indications and risks of  bleeding, infection and recurrence were discussed and understood.  He is now  brought to the operating room for this procedure.   DESCRIPTION OF PROCEDURE:  The patient was brought to the operating room and  placed in supine position on the operating table and general anesthesia  induced.  The right groin was sterilely prepped and draped.  He received  preoperative antibiotics.  An oblique incision was made in the right groin  and dissected carried down through the subcutaneous tissue.  The external  oblique was identified and divided along the lines of its fibers through the  external ring.  The cord was dissected off the floor of the pubic tubercle.  The ilioinguinal nerve was dissected free and protected.  The floor was  relatively intact.  There was a dilated internal ring and a good sized cord  lipoma extending through the internal ring.  This was dissected away from  cord structures, clamped, divided, excised and tied with 3-0 Vicryl.  The  cord was skeletonized.  There was no peritoneal  sac.  The dilated internal  ring was closed with a couple of 2-0 Prolene sutures.  A piece of Parietex  mesh was then trimmed to size to fit the floor of the inguinal canal with  tails going around the cord at the internal ring.  It was sutured initially  to the pubic tubercle and then to the inguinal ligament with running 2-0  Prolene.  Medially the mesh was sutured to the edge of the rectus sheath  with interrupted 2-0 Prolene.  The tails were then tacked together laterally  with interrupted 2-0 Prolene creating an internal ring snug to the finger  tip.  This appeared to provide nice broad coverage of the direct and  indirect spaces.  The soft tissue was infiltrated with Marcaine.  Complete  hemostasis was assured.  The cord and ilioinguinal nerve were returned to  their anatomic position.  The external oblique was closed over  this with  running 3-0 Vicryl.  Scarpa's fascia was closed with running 3-0 Vicryl and  the skin closed with running subcuticular 4-0 Monocryl and Steri-Strips.  Sponge, needle and instrument counts were correct.  A dry sterile dressing  was applied and the patient taken to recovery in good condition.                                                Lorne Skeens. Hoxworth, M.D.    Tory Emerald  D:  04/15/2002  T:  04/15/2002  Job:  [id number]

## 2010-07-07 ENCOUNTER — Other Ambulatory Visit: Payer: Self-pay | Admitting: Internal Medicine

## 2010-07-18 ENCOUNTER — Other Ambulatory Visit: Payer: Self-pay | Admitting: Internal Medicine

## 2010-07-20 ENCOUNTER — Other Ambulatory Visit: Payer: Self-pay | Admitting: Internal Medicine

## 2010-07-29 ENCOUNTER — Other Ambulatory Visit (INDEPENDENT_AMBULATORY_CARE_PROVIDER_SITE_OTHER): Payer: Commercial Managed Care - PPO

## 2010-07-29 DIAGNOSIS — E785 Hyperlipidemia, unspecified: Secondary | ICD-10-CM

## 2010-07-29 DIAGNOSIS — E119 Type 2 diabetes mellitus without complications: Secondary | ICD-10-CM

## 2010-07-29 DIAGNOSIS — I10 Essential (primary) hypertension: Secondary | ICD-10-CM

## 2010-07-29 LAB — BASIC METABOLIC PANEL
BUN: 13 mg/dL (ref 6–23)
Calcium: 9.4 mg/dL (ref 8.4–10.5)
Creatinine, Ser: 0.6 mg/dL (ref 0.4–1.5)
GFR: 191.93 mL/min (ref >60.00)
Glucose, Bld: 84 mg/dL (ref 70–99)
Sodium: 141 mEq/L (ref 135–145)

## 2010-07-29 LAB — TSH: TSH: 0.59 u[IU]/mL (ref 0.35–5.50)

## 2010-07-29 LAB — HEMOGLOBIN A1C: Hgb A1c MFr Bld: 6.4 % (ref 4.6–6.5)

## 2010-07-29 LAB — LIPID PANEL
Cholesterol: 111 mg/dL (ref 0–200)
HDL: 33.8 mg/dL — ABNORMAL LOW (ref >39.00)
Triglycerides: 108 mg/dL (ref 0.0–149.0)
VLDL: 21.6 mg/dL (ref 0.0–40.0)

## 2010-07-29 NOTE — Progress Notes (Signed)
Labs only

## 2010-08-09 ENCOUNTER — Other Ambulatory Visit: Payer: Self-pay | Admitting: Internal Medicine

## 2010-08-09 NOTE — Telephone Encounter (Signed)
Please advise 

## 2010-08-09 NOTE — Telephone Encounter (Signed)
Refill sent.

## 2010-09-29 ENCOUNTER — Other Ambulatory Visit: Payer: Self-pay | Admitting: Internal Medicine

## 2010-09-29 NOTE — Telephone Encounter (Signed)
RX's sent to pharmacy 

## 2010-12-16 ENCOUNTER — Other Ambulatory Visit: Payer: Self-pay | Admitting: Internal Medicine

## 2010-12-16 NOTE — Telephone Encounter (Signed)
Done

## 2010-12-26 ENCOUNTER — Other Ambulatory Visit: Payer: Self-pay | Admitting: Internal Medicine

## 2011-02-21 ENCOUNTER — Other Ambulatory Visit: Payer: Self-pay | Admitting: Internal Medicine

## 2011-03-15 ENCOUNTER — Other Ambulatory Visit: Payer: Self-pay | Admitting: Internal Medicine

## 2011-03-22 ENCOUNTER — Other Ambulatory Visit: Payer: Self-pay | Admitting: Internal Medicine

## 2011-03-22 NOTE — Telephone Encounter (Signed)
A1c 250.00 

## 2011-03-26 ENCOUNTER — Encounter: Payer: Self-pay | Admitting: Internal Medicine

## 2011-04-11 ENCOUNTER — Telehealth: Payer: Self-pay | Admitting: Internal Medicine

## 2011-04-11 DIAGNOSIS — E119 Type 2 diabetes mellitus without complications: Secondary | ICD-10-CM

## 2011-04-11 MED ORDER — METFORMIN HCL 500 MG PO TABS: 500.0000 mg | ORAL_TABLET | Freq: Two times a day (BID) | ORAL | Status: DC

## 2011-04-11 NOTE — Telephone Encounter (Signed)
Refill: Metformin HCL 500 mg tablet. Take 1 tablet by mouth twice daily with meals.

## 2011-04-11 NOTE — Telephone Encounter (Signed)
I left message on cell phone informing patient he is due for A1C (Future order placed for elam) check as indicated on last refill

## 2011-04-13 ENCOUNTER — Other Ambulatory Visit (INDEPENDENT_AMBULATORY_CARE_PROVIDER_SITE_OTHER): Payer: Commercial Managed Care - PPO

## 2011-04-13 DIAGNOSIS — E119 Type 2 diabetes mellitus without complications: Secondary | ICD-10-CM

## 2011-04-13 LAB — HEMOGLOBIN A1C: Hgb A1c MFr Bld: 6.1 % (ref 4.6–6.5)

## 2011-04-19 ENCOUNTER — Telehealth: Payer: Self-pay

## 2011-04-19 NOTE — Telephone Encounter (Signed)
Left message on voicemail informing to call and schedule appointment prior to next refill on diabetic medication

## 2011-04-19 NOTE — Telephone Encounter (Signed)
Message copied by Maurice Small on Wed Apr 19, 2011  3:24 PM ------      Message from: Pecola Lawless      Created: Fri Apr 14, 2011  3:29 PM       A1c GOALS       Non diabetic adults: 5 %-6.1%       Good diabetic control: 6.2-6.4 %       Fair diabetic control: 6.5-7%       Poor diabetic control: greater than 7 % ( except with additional factors such as  advanced age; significant coronary or neurologic disease,etc). Check the A1c every 6 months as it is < 6.5%.Goals for home glucose monitoring are : fasting  or morning glucose goal of  90-150. Two hours after any meal , goal = < 180, preferably < 150.      Report any low blood glucoses immediately.      Please see me before refilling medications; they will need to be decreased or discontinued. [employer]

## 2011-04-25 ENCOUNTER — Ambulatory Visit (INDEPENDENT_AMBULATORY_CARE_PROVIDER_SITE_OTHER): Payer: Commercial Managed Care - PPO | Admitting: Internal Medicine

## 2011-04-25 ENCOUNTER — Encounter: Payer: Self-pay | Admitting: Internal Medicine

## 2011-04-25 DIAGNOSIS — E119 Type 2 diabetes mellitus without complications: Secondary | ICD-10-CM

## 2011-04-25 DIAGNOSIS — I10 Essential (primary) hypertension: Secondary | ICD-10-CM

## 2011-04-25 MED ORDER — CARVEDILOL 6.25 MG PO TABS: 6.2500 mg | ORAL_TABLET | Freq: Two times a day (BID) | ORAL | Status: DC

## 2011-04-25 MED ORDER — METFORMIN HCL 500 MG PO TABS: 500.0000 mg | ORAL_TABLET | Freq: Two times a day (BID) | ORAL | Status: DC

## 2011-04-25 MED ORDER — CARVEDILOL 3.125 MG PO TABS: 6.2500 mg | ORAL_TABLET | Freq: Two times a day (BID) | ORAL | Status: DC

## 2011-04-25 NOTE — Progress Notes (Signed)
Subjective:    Patient ID: Todd Newman, male    DOB: April 23, 1960, 51 y.o.   MRN: [id number]  HPI Diabetes status assessment: Fasting or morning glucose range:  89-113  Highest glucose 2 hours after any meal: < 130. Hypoglycemia :  no .                                                     Excess thirst ;  Excess hunger;  Excess urination:  no                                  Lightheadedness with standing:  no. Chest pain:  no ; Palpitations :no ;  Pain in  calves with walking:  no .                                                                                                                                 Non healing skin  ulcers or sores,especially over the feet:  no. Numbness or tingling or burning in feet : no .                                                                                                                                              Significant change in  Weight : down 19#. Vision changes :no  .                                                                    Exercise : job physical . Nutrition/diet:  Low carb. Medication compliance : yes. Medication adverse  Effects:  no . Eye exam : < 12 mos ago ; no retinopathy Foot care : no.  A1c/ urine microalbumin monitor:  6.1% (6.8% 9/10)      Review of Systems see BP; he has not monitored his blood pressure regular basis. He  is somewhat vague as to BP . See the cardiovascular review of systems above; he also denies edema or paroxysmal nocturnal dyspnea     Objective:   Physical Exam Gen.: Healthy  & well-nourished, appropriate and alert, weight Eyes: No lid/conjunctival changes, extraocular motion intact, fundi benign Neck: Normal  thyroid Respiratory: No increased work of breathing or abnormal breath sounds Cardiac : regular rhythm, no extra heart sounds, gallop, murmur.S 4 Abdomen: No organomegaly ,masses, bruits or aortic enlargement Lymph: No lymphadenopathy of the neck or axilla Skin: No rashes, lesions,  ulcers or ischemic changes Muscle skeletal: no nail changes Vasc:All pulses intact, no bruits present Neuro: Normal deep tendon reflexes, alert & oriented, sensation over feet Psych: judgment and insight, mood and affect normal         Assessment & Plan:

## 2011-04-25 NOTE — Patient Instructions (Addendum)
Blood Pressure Goal  Ideally is an AVERAGE < 135/85. This AVERAGE should be calculated from @ least 5-7 BP readings taken @ different times of day on different days of week. You should not respond to isolated BP readings , but rather the AVERAGE for that week  Please  schedule fasting Labs in 4 mos : BMET,Lipids, hepatic panel, A1c, urine microalbumin. PLEASE BRING THESE INSTRUCTIONS TO FOLLOW UP  LAB APPOINTMENT.This will guarantee correct labs are drawn, eliminating need for repeat blood sampling ( needle sticks ! ). Diagnoses /Codes: 401.9,250.00

## 2011-04-25 NOTE — Assessment & Plan Note (Signed)
It is uncertain whether blood pressure is adequately controlled. He is on very low-dose carvedilol. Because of its cardiac protection, dose will be increased. Blood pressure goals discussed

## 2011-04-25 NOTE — Assessment & Plan Note (Signed)
[name] is at the diabetes cutoff level at 6.1%. Januvia will be discontinued and A1c checked in 4 months with a lipid panel.

## 2011-04-26 ENCOUNTER — Telehealth: Payer: Self-pay | Admitting: Internal Medicine

## 2011-04-26 NOTE — Telephone Encounter (Signed)
Pharmacy sent fax to clarify qty on this RX  Carvedilol (Tab) COREG 6.25 MG Take 1 tablet (6.25 mg total) by mouth 2 (two) times daily with a meal  Should this be #120 for a 30-day supply  Please fax to [phone]

## 2011-04-26 NOTE — Telephone Encounter (Signed)
I called the pharmacy, spoke wit Arlys John. 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

## 2011-05-11 ENCOUNTER — Other Ambulatory Visit: Payer: Self-pay | Admitting: Internal Medicine

## 2011-05-12 ENCOUNTER — Other Ambulatory Visit: Payer: Self-pay | Admitting: Internal Medicine

## 2011-05-15 NOTE — Telephone Encounter (Signed)
Last filled 08-09-10 #6 1, last OV 04-25-11

## 2011-05-15 NOTE — Telephone Encounter (Signed)
Up to 10 or max allowed by Pharmacy

## 2011-05-15 NOTE — Telephone Encounter (Signed)
Rx sent 

## 2011-05-17 ENCOUNTER — Other Ambulatory Visit: Payer: Self-pay | Admitting: Internal Medicine

## 2011-06-06 ENCOUNTER — Telehealth: Payer: Self-pay | Admitting: Internal Medicine

## 2011-06-06 MED ORDER — GLUCOSE BLOOD VI STRP: ORAL_STRIP | Status: DC

## 2011-06-06 NOTE — Telephone Encounter (Signed)
Refill: Truetest glucose test strip. Test twice daily and as needed. Qty 200. Last fill 11-09-10

## 2011-06-06 NOTE — Telephone Encounter (Signed)
RX sent in to check blood sugar once daily for patient is NOT insulin dependant and insurance usually will NOT cover for patient to test more than once daily if NOT insulin dependant

## 2011-07-21 ENCOUNTER — Other Ambulatory Visit: Payer: Self-pay | Admitting: Internal Medicine

## 2011-07-21 NOTE — Telephone Encounter (Signed)
Lipid/Hep 272.4/995.20  

## 2011-08-22 ENCOUNTER — Other Ambulatory Visit: Payer: Self-pay | Admitting: Internal Medicine

## 2011-08-22 NOTE — Telephone Encounter (Signed)
Refill done.  

## 2011-09-19 ENCOUNTER — Other Ambulatory Visit: Payer: Self-pay | Admitting: Internal Medicine

## 2011-09-19 NOTE — Telephone Encounter (Signed)
A1C 250.00 

## 2011-09-26 ENCOUNTER — Other Ambulatory Visit: Payer: Self-pay | Admitting: Internal Medicine

## 2011-09-29 ENCOUNTER — Other Ambulatory Visit: Payer: Self-pay | Admitting: Internal Medicine

## 2011-10-02 ENCOUNTER — Other Ambulatory Visit: Payer: Self-pay | Admitting: Internal Medicine

## 2011-10-02 NOTE — Telephone Encounter (Signed)
Lipid/hep 272.5/995.20

## 2011-10-23 ENCOUNTER — Other Ambulatory Visit: Payer: Self-pay | Admitting: Internal Medicine

## 2011-10-25 ENCOUNTER — Encounter: Payer: Self-pay | Admitting: Internal Medicine

## 2011-10-25 ENCOUNTER — Ambulatory Visit (INDEPENDENT_AMBULATORY_CARE_PROVIDER_SITE_OTHER): Payer: Commercial Managed Care - PPO | Admitting: Internal Medicine

## 2011-10-25 VITALS — BP 124/80 | HR 66 | Temp 98.4°F | Wt 180.4 lb

## 2011-10-25 DIAGNOSIS — E119 Type 2 diabetes mellitus without complications: Secondary | ICD-10-CM

## 2011-10-25 NOTE — Progress Notes (Signed)
Subjective:    Patient ID: Todd Newman, male    DOB: [birth year], 51 y.o.   MRN: [id number]  HPI Diabetes status assessment: Fasting  glucose 30 average :  94  . Highest glucose 2 hours after any meal:  < 130. Hypoglycemia : occasionally as low as 74 .                                                                                                                    Exercise : works out @ gym 3X/ week; work  is physical . Nutrition/diet:  Avoids sweets. Medication compliance : yes. Medication adverse  Effects: no . Eye exam : Ophth seen in 2013. Foot care : no  A1c/ urine microalbumin monitor:  5.9%  Through [facility]       Review of Systems Excess thirst :no;  Excess hunger:  no ;  Excess urination:  no.                                  Lightheadedness with standing:  no. Chest pain:  no ; Palpitations :no ;  Pain in  calves with walking:  no .                                                                                                                                 Non healing skin  ulcers or sores,especially over the feet:  no. Numbness or tingling or burning in feet : no .                                                                                                                                              Significant change in  Weight : 12-14 # loss. Vision changes : no  .  Objective:   Physical Exam Gen.: Healthy and well-nourished in appearance. Alert, appropriate and cooperative throughout exam.  Eyes: No corneal or conjunctival inflammation noted.  Mouth: Oral mucosa and oropharynx reveal no lesions or exudates. Teeth in good repair.  Lungs: Normal respiratory effort; chest expands symmetrically. Lungs are clear to auscultation without rales, wheezes, or increased work of breathing. Heart: Normal rate and rhythm. Normal S1 and S2. No gallop, click, or rub. S4 with slurring at  LSB . Abdomen: Bowel sounds normal; abdomen soft and nontender. No masses,  organomegaly or hernias noted.                                                                               Musculoskeletal/extremities: No deformity or scoliosis noted of  the thoracic or lumbar spine No clubbing, cyanosis, edema, or deformity noted.  Nail health  good. Vascular: Carotid, radial artery, dorsalis pedis and  posterior tibial pulses are full and equal. No bruits present. Neurologic: Alert and oriented x3. Light touch normal over feet.          Skin: Intact without suspicious lesions or rashes. Lymph: No cervical, axillary, or inguinal lymphadenopathy present. Psych: Mood and affect are normal. Normally interactive                                                                                         Assessment & Plan:

## 2011-10-25 NOTE — Assessment & Plan Note (Signed)
His A1c is 5.9%, in the nondiabetic range. Metformin will be discontinued totally and A1c checked with urine microalbumin in 4 months.

## 2011-10-25 NOTE — Patient Instructions (Addendum)
Please  schedule fasting Labs in FOUR months : BMET,Lipids, hepatic panel, A1c, urine microalbumin,TSH.PLEASE BRING THESE INSTRUCTIONS TO JANET HAUSER, RN TO SCHEDULE FOLLOW UP  LABS.This will guarantee correct labs are drawn, eliminating need for repeat blood sampling ( needle sticks ! ). Diagnoses /Codes: 790.29, 272.4,995.20.   If you activate My Chart; the results can be released to you as soon as they populate from the lab. If you choose not to use this program; the labs have to be reviewed, copied & mailed   causing a delay in getting the results to you.

## 2011-10-30 ENCOUNTER — Other Ambulatory Visit: Payer: Self-pay | Admitting: Internal Medicine

## 2011-10-30 NOTE — Telephone Encounter (Signed)
Ranitidine 150 mg Qty:180  Last fill: 07/12/11 Take 1 tablet by mouth twice daily pre-meals until swelling gone

## 2011-10-30 NOTE — Telephone Encounter (Signed)
OK #60,R X 2 

## 2011-10-31 MED ORDER — RANITIDINE HCL 150 MG PO TABS: 150.0000 mg | ORAL_TABLET | Freq: Two times a day (BID) | ORAL | Status: DC

## 2011-10-31 NOTE — Telephone Encounter (Signed)
Rx sent to [facility] pharmacy. 

## 2011-11-06 ENCOUNTER — Other Ambulatory Visit: Payer: Self-pay | Admitting: Internal Medicine

## 2011-11-06 NOTE — Telephone Encounter (Signed)
Refill CRESTOR 20 MG TAKE 1 TABLET BY MOUTH ONCE DAILY # 30 last fill 8.19.13 Last ov 9.11.13 f/u dm, last labs 6.15.12, as per last visit pt was to follow up with fasting labs no future appt made

## 2011-11-07 MED ORDER — ROSUVASTATIN CALCIUM 20 MG PO TABS: ORAL_TABLET | ORAL | Status: DC

## 2011-11-14 ENCOUNTER — Other Ambulatory Visit (INDEPENDENT_AMBULATORY_CARE_PROVIDER_SITE_OTHER): Payer: Commercial Managed Care - PPO

## 2011-11-14 DIAGNOSIS — R7309 Other abnormal glucose: Secondary | ICD-10-CM

## 2011-11-14 DIAGNOSIS — T887XXA Unspecified adverse effect of drug or medicament, initial encounter: Secondary | ICD-10-CM

## 2011-11-14 DIAGNOSIS — E785 Hyperlipidemia, unspecified: Secondary | ICD-10-CM

## 2011-11-14 LAB — LIPID PANEL
HDL: 40.8 mg/dL (ref >39.00)
LDL Cholesterol: 73 mg/dL (ref 0–99)
Total CHOL/HDL Ratio: 3
Triglycerides: 73 mg/dL (ref 0.0–149.0)
VLDL: 14.6 mg/dL (ref 0.0–40.0)

## 2011-11-14 LAB — BASIC METABOLIC PANEL
CO2: 30 mEq/L (ref 19–32)
Chloride: 102 mEq/L (ref 96–112)
Sodium: 138 mEq/L (ref 135–145)

## 2011-11-14 LAB — HEPATIC FUNCTION PANEL: Albumin: 4.3 g/dL (ref 3.5–5.2)

## 2011-11-14 LAB — MICROALBUMIN / CREATININE URINE RATIO
Creatinine,U: 190.5 mg/dL
Microalb Creat Ratio: 0.5 mg/g (ref 0.0–30.0)

## 2011-11-14 LAB — TSH: TSH: 0.93 u[IU]/mL (ref 0.35–5.50)

## 2011-11-14 LAB — HEMOGLOBIN A1C: Hgb A1c MFr Bld: 6.1 % (ref 4.6–6.5)

## 2011-11-17 ENCOUNTER — Other Ambulatory Visit: Payer: Self-pay | Admitting: Internal Medicine

## 2011-12-14 ENCOUNTER — Other Ambulatory Visit: Payer: Self-pay | Admitting: Internal Medicine

## 2012-02-08 ENCOUNTER — Other Ambulatory Visit: Payer: Self-pay | Admitting: Internal Medicine

## 2012-02-12 ENCOUNTER — Other Ambulatory Visit: Payer: Self-pay | Admitting: Internal Medicine

## 2012-02-26 ENCOUNTER — Telehealth: Payer: Self-pay | Admitting: Internal Medicine

## 2012-02-26 ENCOUNTER — Other Ambulatory Visit (INDEPENDENT_AMBULATORY_CARE_PROVIDER_SITE_OTHER): Payer: Commercial Managed Care - PPO

## 2012-02-26 DIAGNOSIS — R7309 Other abnormal glucose: Secondary | ICD-10-CM

## 2012-02-26 DIAGNOSIS — T887XXA Unspecified adverse effect of drug or medicament, initial encounter: Secondary | ICD-10-CM

## 2012-02-26 DIAGNOSIS — E785 Hyperlipidemia, unspecified: Secondary | ICD-10-CM

## 2012-02-26 LAB — HEPATIC FUNCTION PANEL
ALT: 36 U/L (ref 0–53)
AST: 24 U/L (ref 0–37)
Albumin: 4.4 g/dL (ref 3.5–5.2)

## 2012-02-26 LAB — BASIC METABOLIC PANEL
BUN: 11 mg/dL (ref 6–23)
Chloride: 101 mEq/L (ref 96–112)
Creatinine, Ser: 0.6 mg/dL (ref 0.4–1.5)

## 2012-02-26 LAB — LIPID PANEL
Cholesterol: 106 mg/dL (ref 0–200)
VLDL: 9.2 mg/dL (ref 0.0–40.0)

## 2012-02-26 LAB — MICROALBUMIN / CREATININE URINE RATIO
Creatinine,U: 70.7 mg/dL
Microalb, Ur: 0.5 mg/dL (ref 0.0–1.9)

## 2012-02-26 LAB — HEMOGLOBIN A1C: Hgb A1c MFr Bld: 6.7 % — ABNORMAL HIGH (ref 4.6–6.5)

## 2012-02-26 NOTE — Telephone Encounter (Signed)
Pt went to [facility] at 7:30 this morning to have labs drawn, per [facility] will draw the following per last ov  Please schedule fasting Labs in FOUR months : BMET,Lipids, hepatic panel, A1c, urine microalbumin,TSH.PLEASE BRING THESE INSTRUCTIONS TO JANET HAUSER, RN TO SCHEDULE FOLLOW UP LABS.This will guarantee correct labs are drawn, eliminating need for repeat blood sampling ( needle sticks ! ). Diagnoses /Codes: 790.29, 272.4,995.20.  If you activate My Chart; the results can be released to you as soon as they populate from the lab. If you choose not to use this program; the labs have to be reviewed, copied & mailed causing a delay in getting the results to you.        Can you please enter orders for [facility] Thanks

## 2012-02-26 NOTE — Telephone Encounter (Signed)
Orders placed.

## 2012-03-04 ENCOUNTER — Ambulatory Visit (INDEPENDENT_AMBULATORY_CARE_PROVIDER_SITE_OTHER): Payer: Commercial Managed Care - PPO | Admitting: Internal Medicine

## 2012-03-04 ENCOUNTER — Encounter: Payer: Self-pay | Admitting: Internal Medicine

## 2012-03-04 VITALS — BP 126/84 | HR 69 | Wt 180.6 lb

## 2012-03-04 DIAGNOSIS — I10 Essential (primary) hypertension: Secondary | ICD-10-CM

## 2012-03-04 DIAGNOSIS — E119 Type 2 diabetes mellitus without complications: Secondary | ICD-10-CM

## 2012-03-04 DIAGNOSIS — E785 Hyperlipidemia, unspecified: Secondary | ICD-10-CM

## 2012-03-04 DIAGNOSIS — R748 Abnormal levels of other serum enzymes: Secondary | ICD-10-CM

## 2012-03-04 NOTE — Patient Instructions (Addendum)
Minimal Blood Pressure Goal= AVERAGE < 140/90;  Ideal is an AVERAGE < 135/85. This AVERAGE should be calculated from @ least 5-7 BP readings taken @ different times of day on different days of week. You should not respond to isolated BP readings , but rather the AVERAGE for that week .Please bring your  blood pressure cuff to office visits to verify that it is reliable.It  can also be checked against the blood pressure device at the pharmacy. Finger or wrist cuffs are not dependable; an arm cuff is.  Please schedule A1c  in 5-6 months .Code: 250.00 Cardiovascular exercise is recommended 30-45 minutes 3-4 times per week. If you're not exercising you should take 6-8 weeks to build up to this level.  Viagra may be cheaper through  [facility] [phone] (toll-free).

## 2012-03-04 NOTE — Assessment & Plan Note (Signed)
A1c of 6.7% indicates good diabetic control. He describes only rare hypoglycemia. There is no microscopic kidney disease from diabetes. No change is indicated; he should recheck the A1c in  5-6 months.

## 2012-03-04 NOTE — Assessment & Plan Note (Signed)
There is  minimal reduction in Alkaline Phosphatase.Dietary sources of  Alk Phos include whole grains & nuts. Alk Phos is important for optimal liver & bone health.   

## 2012-03-04 NOTE — Progress Notes (Signed)
Subjective:    Patient ID: Todd Newman, male    DOB: September 06, 1960, 52 y.o.   MRN: [id number]  HPI The patient is here for followup of diabetes, hyperlipidemia, and hypertension.  The most recent A1c 02/26/12 was 6.7 %   , which correlates to an average sugar of146  , and long-term risk of  34% . Fasting blood sugar ranges 102- 122 .  Highest two-hour postprandial glucose is <  130. Rare hypoglycemia of 77 in afternoon Last ophthalmologic examination < 12mos  revealed no retinopathy. No active podiatry assessment on record. Diet is low fat  . Exercise as walking ; job physically active .  The most recent lipids  1/13 reveal LDL 62  , HDL 35 , and triglycerides 46   . There is medical compliance with the statin.  Blood pressure range  or average is < 140/90  . There is medical compliance with antihypertensive medications  No  medication adverse effects suggested         Review of Systems Constitutional: No  significant weight change,excess fatigue Eyes: No  blurred vision, double vision, or loss of vision Cardiovascular: no chest pain, palpitations, racing, irregular rhythm,syncope, claudication, or edema  Respiratory: No exertional dyspnea, paroxysmal nocturnal dyspnea Musculoskeletal: No myalgias or muscle cramping  Dermatologic: No non healing  lesions ,change in color or temperature of skin Neurologic: No  limb weakness,  numbness or tingling Endocrine: No change in hair/skin/ nails, excessive thirst, excessive hunger,or  excessive urination      Objective:   Physical Exam Gen.: Healthy and well-nourished in appearance. Alert, appropriate and cooperative throughout exam.   Eyes: No corneal or conjunctival inflammation noted. Pupils equal round reactive to light and accommodation.   Vision grossly normal.  Nose: External nasal exam reveals no deformity or inflammation. Nasal mucosa are pink and moist. No lesions or exudates noted.  Mouth: Oral mucosa and oropharynx  reveal no lesions or exudates. Teeth in good repair. Neck: No deformities, masses, or tenderness noted. Thyroid normal. Lungs: Normal respiratory effort; chest expands symmetrically. Lungs are clear to auscultation without rales, wheezes, or increased work of breathing. Heart: Normal rate and rhythm. Normal S1 and S2. No gallop, click, or rub. S4 w/o murmur. Abdomen: Bowel sounds normal; abdomen soft and nontender. No masses, organomegaly or hernias noted.                              Musculoskeletal/extremities: No deformity or scoliosis noted of  the thoracic or lumbar spine. No clubbing, cyanosis, edema, or significant extremity  deformity noted. Range of motion normal .Tone & strength  normal.Joints normal . Nail health good except R great toe nail thickened. Able to lie down & sit up w/o help.  Vascular: Carotid, radial artery, dorsalis pedis and  posterior tibial pulses are full and equal. No bruits present. Neurologic: Alert and oriented x3. Deep tendon reflexes symmetrical and normal.  Light touch normal over feet.  Skin: Intact without suspicious lesions or rashes. Lymph: No cervical, axillary lymphadenopathy present. Psych: Mood and affect are normal. Normally interactive  Assessment & Plan:

## 2012-03-04 NOTE — Assessment & Plan Note (Signed)
Lipids are at goal except for mildly reduced HDL.Interventions to raise HDL or GOOD cholesterol include: exercising 30-45 minutes 3-4 X per week; including salmon & tuna in the diet;  & supplementing with Omega 3 fatty acids (Flax or Fish oil )  1-2 grams per day. The B vitamin Niacin also raises HDL but has a vasodilating effect which may cause flushing.  No change in medications indicated

## 2012-03-04 NOTE — Assessment & Plan Note (Signed)
Renal function is excellent. Blood pressure goals discussed. No change indicated in  medications

## 2012-03-15 ENCOUNTER — Other Ambulatory Visit: Payer: Self-pay | Admitting: Internal Medicine

## 2012-03-21 ENCOUNTER — Telehealth: Payer: Self-pay | Admitting: Internal Medicine

## 2012-03-21 MED ORDER — ATORVASTATIN CALCIUM 20 MG PO TABS: 20.0000 mg | ORAL_TABLET | Freq: Every day | ORAL | Status: DC

## 2012-03-21 NOTE — Telephone Encounter (Signed)
Hopp please advise  

## 2012-03-21 NOTE — Telephone Encounter (Signed)
Left message to call office to advise Pt of med change and to schedule labs. Rx sent

## 2012-03-21 NOTE — Telephone Encounter (Signed)
Generic Lipitor 20 mg # 90. Please  schedule fasting Labs in 10 weeks after change to generic Lipitor 20 mg  : CK,Lipids, hepatic panel. Codes: [phone]

## 2012-03-21 NOTE — Telephone Encounter (Signed)
medication change request due to cost -- pharmacy wants to change pt from crestor to any of the following simvastatin, atorvastatin, pravastatin or lovastatin--please review and send new rx if approved

## 2012-03-22 NOTE — Telephone Encounter (Signed)
Discussed with pt.  He will call back to schedule labs.

## 2012-04-09 ENCOUNTER — Other Ambulatory Visit: Payer: Self-pay | Admitting: Internal Medicine

## 2012-04-22 ENCOUNTER — Other Ambulatory Visit: Payer: Self-pay | Admitting: Internal Medicine

## 2012-06-13 ENCOUNTER — Other Ambulatory Visit: Payer: Self-pay | Admitting: Internal Medicine

## 2012-08-26 ENCOUNTER — Other Ambulatory Visit: Payer: Self-pay | Admitting: Internal Medicine

## 2012-10-02 ENCOUNTER — Other Ambulatory Visit: Payer: Self-pay | Admitting: Internal Medicine

## 2012-10-03 NOTE — Telephone Encounter (Signed)
Rx sent to the pharmacy by e-script.//AB/CMA 

## 2012-10-24 ENCOUNTER — Encounter: Payer: Self-pay | Admitting: Internal Medicine

## 2012-10-31 ENCOUNTER — Other Ambulatory Visit: Payer: Self-pay | Admitting: Internal Medicine

## 2012-11-05 ENCOUNTER — Telehealth: Payer: Self-pay | Admitting: Internal Medicine

## 2012-11-05 NOTE — Telephone Encounter (Signed)
Refill request for:   Carvedilol 6.25mg  Tablet Original Quantity: 60 Directions: Take one tablet by mouth 2 times a day with a meal. Original fill: 04/22/12 Last fill: 07/30/12 Last OV: 03/04/12

## 2012-11-06 NOTE — Telephone Encounter (Signed)
OK with 2 refills

## 2012-11-06 NOTE — Telephone Encounter (Signed)
Last OV 03-04-12 Med last filled on 04-09-12 #6 with 1 refill.

## 2012-11-06 NOTE — Telephone Encounter (Signed)
Med filled.  

## 2012-11-07 MED ORDER — CARVEDILOL 6.25 MG PO TABS: ORAL_TABLET | ORAL | Status: DC

## 2012-11-07 NOTE — Telephone Encounter (Signed)
Rx sent to the pharmacy by e-script.//AB/CMA 

## 2012-11-07 NOTE — Telephone Encounter (Signed)
OK X1 

## 2012-11-08 ENCOUNTER — Encounter: Payer: Self-pay | Admitting: Internal Medicine

## 2012-11-08 ENCOUNTER — Ambulatory Visit (INDEPENDENT_AMBULATORY_CARE_PROVIDER_SITE_OTHER): Payer: Commercial Managed Care - PPO | Admitting: Internal Medicine

## 2012-11-08 VITALS — BP 130/82 | HR 65 | Temp 98.9°F | Wt 182.8 lb

## 2012-11-08 DIAGNOSIS — E785 Hyperlipidemia, unspecified: Secondary | ICD-10-CM

## 2012-11-08 DIAGNOSIS — E119 Type 2 diabetes mellitus without complications: Secondary | ICD-10-CM

## 2012-11-08 DIAGNOSIS — I10 Essential (primary) hypertension: Secondary | ICD-10-CM

## 2012-11-08 MED ORDER — GLUCOSE BLOOD VI STRP: ORAL_STRIP | Status: DC

## 2012-11-08 MED ORDER — CARVEDILOL 6.25 MG PO TABS: ORAL_TABLET | ORAL | Status: DC

## 2012-11-08 NOTE — Assessment & Plan Note (Signed)
Blood pressure well controlled.BMET indicated

## 2012-11-08 NOTE — Patient Instructions (Addendum)
Please make an appointment to see Todd Mon, RN @ [facility]. Fasting labs are indicated. Please take all your actual pill bottles to that appointment.

## 2012-11-08 NOTE — Progress Notes (Signed)
Subjective:    Patient ID: Todd Newman, male    DOB: 22-Aug-1960, 52 y.o.   MRN: [id number]  HPI The patient is here for followup of diabetes hyperlipidemia and hypertension.  The most recent A1c was 6.7 % in 02/2012  , which correlates to an average sugar of  146 , and long-term risk of 34 % . Fasting blood sugar  not monitored ; he has no strips.  Highest two-hour postprandial glucose is  not monitored . No hypoglycemia reported Last ophthalmologic examination current; this  revealed no retinopathy. No active podiatry assessment on record. Diet instruction " from [place]". Exercise  3-4 X/ week as walking & gym for  15-20 minutes.  The most recent lipids 1/14  reveal LDL 62 ; HDL 35 ; and triglycerides  46  . There is medical compliance with the statin but he may be on TWO statins.  Blood pressure not recorded  . There is medical   with antihypertensive medications  No  medication adverse effects         Review of Systems Constitutional: No  significant weight change;  excess fatigue Eyes: No  blurred vision;  double vision ; loss of vision Cardiovascular: no chest pain ;palpitations; racing; irregular rhythm ;syncope; claudication ; edema  Respiratory: No exertional dyspnea;  paroxysmal nocturnal dyspnea Musculoskeletal: No myalgias or muscle cramping  Dermatologic: No non healing  Lesions; change in color or temperature of skin Neurologic: No  limb weakness;  numbness or tingling;  burning Endocrine: No change in hair, skin, nails. No excessive thirst; excessive hunger;  excessive urination       Objective:   Physical Exam Gen.: Healthy and well-nourished in appearance. Alert, appropriate and cooperative throughout exam.Appears younger than stated age  Head: Normocephalic without obvious abnormalities  Eyes: No corneal or conjunctival inflammation noted. Pupils equal round reactive to light and accommodation.  Nose: External nasal exam reveals no deformity or  inflammation. Nasal mucosa are pink and moist. No lesions or exudates noted.  Mouth: Oral mucosa and oropharynx reveal no lesions or exudates. Teeth in good repair. Neck: No deformities, masses, or tenderness noted.  Thyroid normal. Lungs: Normal respiratory effort; chest expands symmetrically. Lungs are clear to auscultation without rales, wheezes, or increased work of breathing. Heart: Normal rate and rhythm. Normal S1 and S2. No gallop, click, or rub. No murmur. Abdomen: Bowel sounds normal; abdomen soft and nontender. No masses, organomegaly or hernias noted.               Musculoskeletal/extremities: No deformity or scoliosis noted of  the thoracic or lumbar spine.  No clubbing, cyanosis, edema, or significant extremity  deformity noted. Range of motion normal .Tone & strength  Normal. Joints normal . Nail health good. Able to lie down & sit up w/o help. Negative SLR bilaterally Vascular: Carotid, radial artery, dorsalis pedis and  posterior tibial pulses are full and equal. No bruits present. Neurologic: Alert and oriented x3. Deep tendon reflexes symmetrical and normal.         Skin: Intact without suspicious lesions or rashes. Lymph: No cervical, axillary lymphadenopathy present. Psych: Mood and affect are normal. Normally interactive  Assessment & Plan:  See Current Assessment & Plan in Problem List under specific Diagnosis

## 2012-11-08 NOTE — Assessment & Plan Note (Signed)
A1c and urine microalbumin through [facility]

## 2012-11-08 NOTE — Assessment & Plan Note (Signed)
Verification needed as to whether he is taking both atorvastatin and Crestor which would be inappropriate. Fasting lipids, CK, hepatic panel indicated

## 2012-11-12 ENCOUNTER — Other Ambulatory Visit: Payer: Self-pay | Admitting: Internal Medicine

## 2012-11-12 ENCOUNTER — Telehealth: Payer: Self-pay | Admitting: *Deleted

## 2012-11-12 DIAGNOSIS — E119 Type 2 diabetes mellitus without complications: Secondary | ICD-10-CM

## 2012-11-12 DIAGNOSIS — I10 Essential (primary) hypertension: Secondary | ICD-10-CM

## 2012-11-12 DIAGNOSIS — E785 Hyperlipidemia, unspecified: Secondary | ICD-10-CM

## 2012-11-12 NOTE — Telephone Encounter (Signed)
Orders entered into the EMR for Todd Newman. Please verify with his pharmacy if he is taking TWO statins.

## 2012-11-12 NOTE — Telephone Encounter (Signed)
Janet's (RN from [employer]) phone number is [phone]

## 2012-11-12 NOTE — Telephone Encounter (Signed)
Elizebeth Koller, RN, called this afternoon and would like to know what fasting labs need to be drawn for patient? She stated that the labs would need to be ordered and needs to be drawn at [facility] since patient works at [employer]. Please advise as to which labs should be ordered.

## 2012-11-13 ENCOUNTER — Encounter: Payer: Self-pay | Admitting: Internal Medicine

## 2012-11-13 NOTE — Telephone Encounter (Signed)
He should continue the Crestor destroy the prescription for Lipitor.

## 2012-11-13 NOTE — Telephone Encounter (Signed)
Spoke with the pharmacy and they said he has an rx for the Lipitor that was called in in Feb, but he has not filled it.  The pt only has picked-up the Crestor, which he picked up last on 10-02-12 #90.//AB/CMA

## 2012-11-18 ENCOUNTER — Encounter: Payer: Self-pay | Admitting: *Deleted

## 2012-11-18 ENCOUNTER — Other Ambulatory Visit (INDEPENDENT_AMBULATORY_CARE_PROVIDER_SITE_OTHER): Payer: Commercial Managed Care - PPO

## 2012-11-18 DIAGNOSIS — I10 Essential (primary) hypertension: Secondary | ICD-10-CM

## 2012-11-18 DIAGNOSIS — E119 Type 2 diabetes mellitus without complications: Secondary | ICD-10-CM

## 2012-11-18 DIAGNOSIS — E785 Hyperlipidemia, unspecified: Secondary | ICD-10-CM

## 2012-11-18 LAB — BASIC METABOLIC PANEL
CO2: 30 mEq/L (ref 19–32)
Calcium: 9.3 mg/dL (ref 8.4–10.5)
Creatinine, Ser: 0.7 mg/dL (ref 0.4–1.5)
GFR: 155.6 mL/min (ref >60.00)
Sodium: 136 mEq/L (ref 135–145)

## 2012-11-18 LAB — LIPID PANEL
HDL: 42.1 mg/dL (ref >39.00)
LDL Cholesterol: 73 mg/dL (ref 0–99)
Total CHOL/HDL Ratio: 3
Triglycerides: 70 mg/dL (ref 0.0–149.0)
VLDL: 14 mg/dL (ref 0.0–40.0)

## 2012-11-18 LAB — HEPATIC FUNCTION PANEL
Alkaline Phosphatase: 36 U/L — ABNORMAL LOW (ref 39–117)
Bilirubin, Direct: 0 mg/dL (ref 0.0–0.3)
Total Bilirubin: 0.4 mg/dL (ref 0.3–1.2)

## 2012-11-18 NOTE — Progress Notes (Signed)
Letter mailed to patient.

## 2012-12-03 ENCOUNTER — Other Ambulatory Visit: Payer: Self-pay | Admitting: Internal Medicine

## 2012-12-03 NOTE — Telephone Encounter (Signed)
Carvedilol refill sent to pharmacy 

## 2012-12-17 ENCOUNTER — Other Ambulatory Visit: Payer: Self-pay | Admitting: *Deleted

## 2012-12-17 MED ORDER — HYDROCHLOROTHIAZIDE 25 MG PO TABS: ORAL_TABLET | ORAL | Status: DC

## 2012-12-17 NOTE — Telephone Encounter (Signed)
Hydrochlorothiazide refill sent to pharamcy

## 2013-04-01 ENCOUNTER — Telehealth: Payer: Self-pay | Admitting: Internal Medicine

## 2013-04-01 NOTE — Telephone Encounter (Signed)
Letter from [facility] review, patient likes to get established with me, please arrange an appointment at his earliest convenience

## 2013-04-02 NOTE — Telephone Encounter (Signed)
Pt scheduled 04/11/13

## 2013-04-09 ENCOUNTER — Ambulatory Visit: Payer: Commercial Managed Care - PPO | Admitting: Internal Medicine

## 2013-04-11 ENCOUNTER — Encounter: Payer: Self-pay | Admitting: Internal Medicine

## 2013-04-11 ENCOUNTER — Ambulatory Visit (INDEPENDENT_AMBULATORY_CARE_PROVIDER_SITE_OTHER): Payer: Commercial Managed Care - PPO | Admitting: Internal Medicine

## 2013-04-11 VITALS — BP 137/82 | HR 66 | Temp 98.2°F | Ht 68.2 in | Wt 183.0 lb

## 2013-04-11 DIAGNOSIS — E119 Type 2 diabetes mellitus without complications: Secondary | ICD-10-CM

## 2013-04-11 DIAGNOSIS — I10 Essential (primary) hypertension: Secondary | ICD-10-CM

## 2013-04-11 DIAGNOSIS — K219 Gastro-esophageal reflux disease without esophagitis: Secondary | ICD-10-CM

## 2013-04-11 DIAGNOSIS — E785 Hyperlipidemia, unspecified: Secondary | ICD-10-CM

## 2013-04-11 MED ORDER — GLUCOSE BLOOD VI STRP: ORAL_STRIP | Status: DC

## 2013-04-11 NOTE — Assessment & Plan Note (Addendum)
seems well-controlled, check a  CBC, Last BMP satisfactory
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
## 2013-04-11 NOTE — Patient Instructions (Signed)
Please go to [facility] next week and get your labs done  Next visit is for a physical exam in 3 months, fasting Please make an appointment

## 2013-04-11 NOTE — Assessment & Plan Note (Signed)
On diet only, labs 

## 2013-04-11 NOTE — Progress Notes (Signed)
Pre visit review using our clinic review tool, if applicable. No additional management support is needed unless otherwise documented below in the visit note. 

## 2013-04-11 NOTE — Assessment & Plan Note (Signed)
Not taking any medications in few months, recent? Patient is not sure why. Plan: Labs next week, orders entered

## 2013-04-14 ENCOUNTER — Other Ambulatory Visit: Payer: Self-pay | Admitting: Internal Medicine

## 2013-04-15 ENCOUNTER — Telehealth: Payer: Self-pay

## 2013-04-15 ENCOUNTER — Other Ambulatory Visit (INDEPENDENT_AMBULATORY_CARE_PROVIDER_SITE_OTHER): Payer: Commercial Managed Care - PPO

## 2013-04-15 DIAGNOSIS — I10 Essential (primary) hypertension: Secondary | ICD-10-CM

## 2013-04-15 DIAGNOSIS — E119 Type 2 diabetes mellitus without complications: Secondary | ICD-10-CM

## 2013-04-15 DIAGNOSIS — E785 Hyperlipidemia, unspecified: Secondary | ICD-10-CM

## 2013-04-15 LAB — LIPID PANEL
CHOL/HDL RATIO: 5
CHOLESTEROL: 206 mg/dL — AB (ref 0–200)
HDL: 44.7 mg/dL (ref >39.00)
LDL Cholesterol: 138 mg/dL — ABNORMAL HIGH (ref 0–99)
Triglycerides: 117 mg/dL (ref 0.0–149.0)
VLDL: 23.4 mg/dL (ref 0.0–40.0)

## 2013-04-15 LAB — CBC WITH DIFFERENTIAL/PLATELET
BASOS PCT: 0.6 % (ref 0.0–3.0)
Basophils Absolute: 0 10*3/uL (ref 0.0–0.1)
Eosinophils Absolute: 0.2 10*3/uL (ref 0.0–0.7)
Eosinophils Relative: 4.1 % (ref 0.0–5.0)
HEMATOCRIT: 42 % (ref 39.0–52.0)
Hemoglobin: 14.2 g/dL (ref 13.0–17.0)
LYMPHS ABS: 1.6 10*3/uL (ref 0.7–4.0)
Lymphocytes Relative: 31 % (ref 12.0–46.0)
MCHC: 33.8 g/dL (ref 30.0–36.0)
MCV: 90.2 fl (ref 78.0–100.0)
MONO ABS: 0.6 10*3/uL (ref 0.1–1.0)
Monocytes Relative: 11.4 % (ref 3.0–12.0)
NEUTROS ABS: 2.8 10*3/uL (ref 1.4–7.7)
NEUTROS PCT: 52.9 % (ref 43.0–77.0)
Platelets: 343 10*3/uL (ref 150.0–400.0)
RBC: 4.66 Mil/uL (ref 4.22–5.81)
RDW: 14.4 % (ref 11.5–14.6)
WBC: 5.2 10*3/uL (ref 4.5–10.5)

## 2013-04-15 LAB — HEMOGLOBIN A1C: Hgb A1c MFr Bld: 6.3 % (ref 4.6–6.5)

## 2013-04-15 LAB — AST: AST: 23 U/L (ref 0–37)

## 2013-04-15 LAB — ALT: ALT: 39 U/L (ref 0–53)

## 2013-04-15 NOTE — Telephone Encounter (Signed)
Relevant patient education mailed to patient.  

## 2013-04-24 ENCOUNTER — Telehealth: Payer: Self-pay | Admitting: *Deleted

## 2013-04-24 NOTE — Telephone Encounter (Signed)
Received a message from Kelli Churn with [facility] to make [place] aware that pt is unable to afford the $75.00 copay for Crestor but could receive simvastatin, lovastatin or atorvastatin at no cost to the patient if you feel any of these medications are appropriate

## 2013-04-25 MED ORDER — ATORVASTATIN CALCIUM 20 MG PO TABS: 20.0000 mg | ORAL_TABLET | Freq: Every day | ORAL | Status: DC

## 2013-04-25 NOTE — Addendum Note (Signed)
Addended by: Colon Branch on: 04/25/2013 08:42 AM   Modules accepted: Orders

## 2013-04-25 NOTE — Telephone Encounter (Signed)
Called the patient at home and spoke with his daughter. Advised that Dr. Larose Kells has sent in a medication to treat pts high cholesterol, he is to take 1 tablet every night at bedtime. He also needs to repeat his labwork in 6 weeks. Patients daughter was able to repeat instructions to me.

## 2013-04-25 NOTE — Telephone Encounter (Signed)
Advise patient, I sent a prescription for Lipitor 20 mg, take one tablet at night. Arrange for FLP, AST, ALT in 6 weeks, DX hyperlipidemia

## 2013-04-28 NOTE — Telephone Encounter (Signed)
Spoke with patient and made aware of labs and that he needs to start Lipitor. He verbalized understanding of this and will go to the pharmacy to pick up medication. Patient stated he would call the office back to schedule follow up labs.

## 2013-06-25 ENCOUNTER — Other Ambulatory Visit: Payer: Self-pay

## 2013-06-25 MED ORDER — CARVEDILOL 6.25 MG PO TABS: ORAL_TABLET | ORAL | Status: DC

## 2013-09-10 ENCOUNTER — Other Ambulatory Visit: Payer: Self-pay | Admitting: *Deleted

## 2013-09-10 MED ORDER — ATORVASTATIN CALCIUM 20 MG PO TABS: 20.0000 mg | ORAL_TABLET | Freq: Every day | ORAL | Status: DC

## 2013-10-13 ENCOUNTER — Telehealth: Payer: Self-pay

## 2013-10-13 NOTE — Telephone Encounter (Signed)
Attempted to contact pt to schedule a follow up appt with Dr. Linna Darner.   All numbers listed were invalid or mailbox was full.   Sending letter regarding diabetes maintenance.   DM bundle pt.

## 2013-10-16 ENCOUNTER — Other Ambulatory Visit: Payer: Self-pay

## 2013-10-16 MED ORDER — RANITIDINE HCL 150 MG PO TABS: ORAL_TABLET | ORAL | Status: DC

## 2013-10-24 ENCOUNTER — Ambulatory Visit (INDEPENDENT_AMBULATORY_CARE_PROVIDER_SITE_OTHER): Payer: Commercial Managed Care - PPO | Admitting: Internal Medicine

## 2013-10-24 ENCOUNTER — Encounter: Payer: Self-pay | Admitting: Internal Medicine

## 2013-10-24 VITALS — BP 134/80 | HR 66 | Temp 98.5°F | Wt 186.5 lb

## 2013-10-24 DIAGNOSIS — I10 Essential (primary) hypertension: Secondary | ICD-10-CM

## 2013-10-24 DIAGNOSIS — E119 Type 2 diabetes mellitus without complications: Secondary | ICD-10-CM

## 2013-10-24 DIAGNOSIS — E785 Hyperlipidemia, unspecified: Secondary | ICD-10-CM

## 2013-10-24 NOTE — Progress Notes (Signed)
Pre visit review using our clinic review tool, if applicable. No additional management support is needed unless otherwise documented below in the visit note. 
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
## 2013-10-24 NOTE — Assessment & Plan Note (Signed)
well-controlled, due for labs, see instructions

## 2013-10-24 NOTE — Assessment & Plan Note (Signed)
Feet exam negative today Reports saw the eye M.D. Early this year and got good reports

## 2013-10-24 NOTE — Assessment & Plan Note (Signed)
Started atorvastatin March 2015, no apparent side effects, due for labs. Labs, see instructions

## 2013-10-24 NOTE — Patient Instructions (Addendum)
Stop by the front desk and schedule labs to be done within few days (fasting) CMP  A1C --- dx DM FLP --- dx High cholesterol   Please come back to the office in 3 months  for a  physical exam. Come back fasting    Stop by the front desk and schedule the visit

## 2013-10-28 ENCOUNTER — Other Ambulatory Visit (INDEPENDENT_AMBULATORY_CARE_PROVIDER_SITE_OTHER): Payer: Commercial Managed Care - PPO

## 2013-10-28 DIAGNOSIS — E1110 Type 2 diabetes mellitus with ketoacidosis without coma: Secondary | ICD-10-CM

## 2013-10-28 DIAGNOSIS — E1310 Other specified diabetes mellitus with ketoacidosis without coma: Secondary | ICD-10-CM

## 2013-10-28 DIAGNOSIS — E7800 Pure hypercholesterolemia, unspecified: Secondary | ICD-10-CM

## 2013-10-28 LAB — COMPREHENSIVE METABOLIC PANEL
ALK PHOS: 35 U/L — AB (ref 39–117)
ALT: 45 U/L (ref 0–53)
AST: 24 U/L (ref 0–37)
Albumin: 4.6 g/dL (ref 3.5–5.2)
BUN: 16 mg/dL (ref 6–23)
CO2: 28 mEq/L (ref 19–32)
Calcium: 9.5 mg/dL (ref 8.4–10.5)
Chloride: 101 mEq/L (ref 96–112)
Creatinine, Ser: 0.6 mg/dL (ref 0.4–1.5)
GFR: 169.08 mL/min (ref >60.00)
Glucose, Bld: 114 mg/dL — ABNORMAL HIGH (ref 70–99)
POTASSIUM: 3.6 meq/L (ref 3.5–5.1)
Sodium: 138 mEq/L (ref 135–145)
Total Bilirubin: 0.7 mg/dL (ref 0.2–1.2)
Total Protein: 8.3 g/dL (ref 6.0–8.3)

## 2013-10-28 LAB — LIPID PANEL
CHOLESTEROL: 140 mg/dL (ref 0–200)
HDL: 37 mg/dL — ABNORMAL LOW (ref >39.00)
LDL CALC: 81 mg/dL (ref 0–99)
NonHDL: 103
TRIGLYCERIDES: 110 mg/dL (ref 0.0–149.0)
Total CHOL/HDL Ratio: 4
VLDL: 22 mg/dL (ref 0.0–40.0)

## 2013-10-28 LAB — HEMOGLOBIN A1C: Hgb A1c MFr Bld: 6.6 % — ABNORMAL HIGH (ref 4.6–6.5)

## 2013-11-26 ENCOUNTER — Other Ambulatory Visit: Payer: Self-pay | Admitting: Internal Medicine

## 2013-12-11 ENCOUNTER — Other Ambulatory Visit: Payer: Self-pay | Admitting: Internal Medicine

## 2013-12-12 ENCOUNTER — Other Ambulatory Visit: Payer: Self-pay

## 2013-12-12 MED ORDER — HYDROCHLOROTHIAZIDE 25 MG PO TABS: ORAL_TABLET | ORAL | Status: DC

## 2014-01-23 ENCOUNTER — Other Ambulatory Visit: Payer: Self-pay | Admitting: Internal Medicine

## 2014-01-30 ENCOUNTER — Ambulatory Visit (INDEPENDENT_AMBULATORY_CARE_PROVIDER_SITE_OTHER): Payer: Commercial Managed Care - PPO | Admitting: Internal Medicine

## 2014-01-30 ENCOUNTER — Encounter: Payer: Self-pay | Admitting: Internal Medicine

## 2014-01-30 VITALS — BP 152/96 | HR 94 | Temp 98.0°F | Ht 68.0 in | Wt 188.2 lb

## 2014-01-30 DIAGNOSIS — E119 Type 2 diabetes mellitus without complications: Secondary | ICD-10-CM

## 2014-01-30 DIAGNOSIS — Z23 Encounter for immunization: Secondary | ICD-10-CM

## 2014-01-30 DIAGNOSIS — Z0000 Encounter for general adult medical examination without abnormal findings: Secondary | ICD-10-CM | POA: Insufficient documentation

## 2014-01-30 LAB — MICROALBUMIN / CREATININE URINE RATIO
Creatinine,U: 196.1 mg/dL
Microalb Creat Ratio: 1.6 mg/g (ref 0.0–30.0)
Microalb, Ur: 3.2 mg/dL — ABNORMAL HIGH (ref 0.0–1.9)

## 2014-01-30 LAB — HEMOGLOBIN A1C: Hgb A1c MFr Bld: 7 % — ABNORMAL HIGH (ref 4.6–6.5)

## 2014-01-30 MED ORDER — CARVEDILOL 6.25 MG PO TABS: ORAL_TABLET | ORAL | Status: DC

## 2014-01-30 MED ORDER — HYDROCHLOROTHIAZIDE 25 MG PO TABS: ORAL_TABLET | ORAL | Status: DC

## 2014-01-30 MED ORDER — RANITIDINE HCL 150 MG PO TABS: ORAL_TABLET | ORAL | Status: DC

## 2014-01-30 MED ORDER — AMLODIPINE BESYLATE 5 MG PO TABS: 5.0000 mg | ORAL_TABLET | Freq: Every day | ORAL | Status: DC

## 2014-01-30 MED ORDER — SILDENAFIL CITRATE 100 MG PO TABS: ORAL_TABLET | ORAL | Status: DC

## 2014-01-30 MED ORDER — ATORVASTATIN CALCIUM 20 MG PO TABS: 20.0000 mg | ORAL_TABLET | Freq: Every day | ORAL | Status: DC
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
## 2014-01-30 NOTE — Assessment & Plan Note (Addendum)
Td 2010 Pneumonia shot today Had a flu shot already + Family history of heart disease, plan is to control cardiovascular risk factors. Colon cancer screening discussed, never had a colonoscopy, benefits of colonoscopy discussed, provided ifob will call when ready for a colonoscopy Prostate cancer screening, DRE normal today, check a PSA Diet and exercise discussed EKG-- nsr  Other issues: Hypertension, ran out of medications a week ago, BP today slightly elevated but previously normal. Plan: Refill medications, monitor ambulatory BPs. Continue with amlodipine and carvedilol and hydrochlorothiazide Hyperlipidemia, on Lipitor, last FLP satisfactory, refill medications.

## 2014-01-30 NOTE — Progress Notes (Signed)
Pre visit review using our clinic review tool, if applicable. No additional management support is needed unless otherwise documented below in the visit note. 

## 2014-01-30 NOTE — Patient Instructions (Signed)
Get your blood work before you leave    Please come back to the office in 5-6 months  for a routine check up   Come back fasting

## 2014-02-04 MED ORDER — SITAGLIPTIN PHOSPHATE 100 MG PO TABS: 100.0000 mg | ORAL_TABLET | Freq: Every day | ORAL | Status: DC

## 2014-02-04 NOTE — Addendum Note (Signed)
Addended by: Wilfrid Lund on: 02/04/2014 01:31 PM   Modules accepted: Orders

## 2014-02-05 LAB — PSA: PSA: 0.84 ng/mL (ref <=4.00)

## 2014-02-05 NOTE — Addendum Note (Signed)
Addended by: Modena Morrow D on: 02/05/2014 02:13 PM   Modules accepted: Orders

## 2014-03-03 LAB — HM DIABETES EYE EXAM

## 2014-06-11 ENCOUNTER — Other Ambulatory Visit: Payer: Self-pay | Admitting: Internal Medicine

## 2014-07-30 ENCOUNTER — Other Ambulatory Visit: Payer: Self-pay | Admitting: Internal Medicine

## 2014-08-03 ENCOUNTER — Ambulatory Visit (INDEPENDENT_AMBULATORY_CARE_PROVIDER_SITE_OTHER): Payer: 59 | Admitting: Internal Medicine

## 2014-08-03 ENCOUNTER — Encounter: Payer: Self-pay | Admitting: Internal Medicine

## 2014-08-03 ENCOUNTER — Ambulatory Visit (HOSPITAL_BASED_OUTPATIENT_CLINIC_OR_DEPARTMENT_OTHER)
Admission: RE | Admit: 2014-08-03 | Discharge: 2014-08-03 | Disposition: A | Discharge status: Home / Self Care | Payer: 59 | Source: Ambulatory Visit | Attending: Internal Medicine | Admitting: Internal Medicine

## 2014-08-03 VITALS — BP 126/88 | HR 73 | Temp 98.2°F | Ht 68.0 in | Wt 191.2 lb

## 2014-08-03 DIAGNOSIS — M25531 Pain in right wrist: Secondary | ICD-10-CM | POA: Insufficient documentation

## 2014-08-03 DIAGNOSIS — I10 Essential (primary) hypertension: Secondary | ICD-10-CM | POA: Diagnosis not present

## 2014-08-03 DIAGNOSIS — M779 Enthesopathy, unspecified: Secondary | ICD-10-CM

## 2014-08-03 DIAGNOSIS — E119 Type 2 diabetes mellitus without complications: Secondary | ICD-10-CM

## 2014-08-03 NOTE — Patient Instructions (Signed)
Go to the lab Go to the first floor and get an x-ray   tomese la sangre antes de irse saque una cita para verme en 4 meses tomese una radiografia en Occupational psychologist
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
## 2014-08-03 NOTE — Progress Notes (Signed)
Pre visit review using our clinic review tool, if applicable. No additional management support is needed unless otherwise documented below in the visit note. 

## 2014-08-04 LAB — BASIC METABOLIC PANEL
BUN: 20 mg/dL (ref 6–23)
CALCIUM: 9.9 mg/dL (ref 8.4–10.5)
CO2: 29 mEq/L (ref 19–32)
CREATININE: 1.09 mg/dL (ref 0.40–1.50)
Chloride: 100 mEq/L (ref 96–112)
GFR: 91.19 mL/min (ref >60.00)
Glucose, Bld: 93 mg/dL (ref 70–99)
Potassium: 3.8 mEq/L (ref 3.5–5.1)
Sodium: 138 mEq/L (ref 135–145)

## 2014-08-04 LAB — HEMOGLOBIN A1C: HEMOGLOBIN A1C: 6.4 % (ref 4.6–6.5)

## 2014-08-04 LAB — ALT: ALT: 41 U/L (ref 0–53)

## 2014-08-04 LAB — AST: AST: 23 U/L (ref 0–37)

## 2014-08-04 NOTE — Assessment & Plan Note (Signed)
Diabetes, Based on the last A1c we started Januvia. Labs

## 2014-08-13 ENCOUNTER — Encounter: Payer: Self-pay | Admitting: Family Medicine

## 2014-08-13 ENCOUNTER — Ambulatory Visit (INDEPENDENT_AMBULATORY_CARE_PROVIDER_SITE_OTHER): Payer: 59 | Admitting: Family Medicine

## 2014-08-13 VITALS — BP 159/98 | HR 66 | Ht 66.0 in | Wt 187.0 lb

## 2014-08-13 DIAGNOSIS — M79644 Pain in right finger(s): Secondary | ICD-10-CM | POA: Diagnosis not present

## 2014-08-13 DIAGNOSIS — M654 Radial styloid tenosynovitis [de Quervain]: Secondary | ICD-10-CM | POA: Diagnosis not present

## 2014-08-13 MED ORDER — METHYLPREDNISOLONE ACETATE 40 MG/ML IJ SUSP
40.0000 mg | Freq: Once | INTRAMUSCULAR | Status: AC
  Administered 2014-08-13: 20 mg via INTRA_ARTICULAR

## 2014-08-13 NOTE — Patient Instructions (Signed)
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 Avoid painful activities as much as possible. Wear the thumb spica brace as often as possible to rest this. Ice 15 minutes at a time 3-4 times a day. A cortisone injection typically helps a great deal with this and is an option - you were given this today. Consider prednisone dose pack or an anti-inflammatory if needed. Follow up with me in 1 month for reevaluation.

## 2014-08-18 DIAGNOSIS — M654 Radial styloid tenosynovitis [de Quervain]: Secondary | ICD-10-CM | POA: Insufficient documentation
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
## 2014-08-18 NOTE — Assessment & Plan Note (Signed)
Right dequervains tenosynovitis - Discussed options - went ahead with injection today.  Thumb spica brace to help rest this.  Icing as needed.  F/u in 1 month.  After informed written consent patient was seated in chair in exam room.  Area overlying right 1st dorsal compartment of wrist prepped with alcohol swab then injected with 0.5:0.54mL marcaine: depomedrol.  Patient tolerated procedure well without immediate complications.

## 2014-08-25 ENCOUNTER — Ambulatory Visit: Payer: Commercial Managed Care - PPO | Admitting: *Deleted

## 2014-08-27 ENCOUNTER — Other Ambulatory Visit: Payer: Self-pay | Admitting: Internal Medicine

## 2014-09-08 ENCOUNTER — Other Ambulatory Visit: Payer: Self-pay | Admitting: *Deleted

## 2014-09-09 NOTE — Patient Outreach (Addendum)
[facility]) Care Management   09/08/14  Todd Newman Aug 04, 1960 [id number]  Todd Newman is an 54 y.o. male who presents for twice yearly routine Link To Wellness follow up for self management assistance with Type II DM, HTN and hyperlipidemia. With [name]' verbal permission, [employer] PGY2 pharmacy student Darcella Cheshire, observed the visit.  Subjective:  [name] has no complaints today however he says he saw a sports medicine MD on 6/30 for right thumb pain ( per EPIC note the dx was right DeQuervain's tenosynovitis) and received a steroid injection into his wrist that resolved the pain.  Objective:   Review of Systems  Constitutional: Negative.    Filed Weights   09/08/14 1507  Weight: 191 lb 9.6 oz (86.909 kg)   Filed Vitals:   09/08/14 1507  BP: 140/90  POC pre meal CBG= 77   Physical Exam  Constitutional: He is oriented to person, place, and time. He appears well-developed and well-nourished.  Neurological: He is alert and oriented to person, place, and time.  Skin: Skin is warm and dry.  Psychiatric: He has a normal mood and affect. His behavior is normal. Judgment and thought content normal.    Current Medications:   Current Outpatient Prescriptions  Medication Sig Dispense Refill  . amLODipine (NORVASC) 5 MG tablet Take 1 tablet (5 mg total) by mouth daily. 90 tablet 2  . atorvastatin (LIPITOR) 20 MG tablet Take 1 tablet (20 mg total) by mouth daily. 90 tablet 2  . Blood Glucose Monitoring Suppl (TRUERESULT BLOOD GLUCOSE) W/DEVICE KIT by Does not apply route. Check blood sugar daily DX:250.00     . carvedilol (COREG) 6.25 MG tablet Take 1 tablet (6.25 mg total) by mouth 2 (two) times daily with a meal. 60 tablet 1  . hydrochlorothiazide (HYDRODIURIL) 25 MG tablet TAKE 1/2 TABLET BY MOUTH DAILY 90 tablet 2  . ranitidine (ZANTAC) 150 MG tablet TAKE 1 TABLET BY MOUTH 2 TIMES DAILY. 180 tablet 2  . sildenafil (VIAGRA) 100 MG tablet Take one tablet by  mouth as needed  as directed by your doctor 6 tablet 3  . sitaGLIPtin (JANUVIA) 100 MG tablet Take 1 tablet (100 mg total) by mouth daily. 30 tablet 1  . TRUETEST TEST test strip USE TO CHECK BLOOD SUGAR ONCE DAILY. 100 each 11   No current facility-administered medications for this visit.    Functional Status:   In your present state of health, do you have any difficulty performing the following activities: 09/08/2014 08/03/2014  Hearing? N N  Vision? N N  Difficulty concentrating or making decisions? N N  Walking or climbing stairs? N N  Dressing or bathing? N N  Doing errands, shopping? N N    Fall/Depression Screening:    PHQ 2/9 Scores 09/08/2014  PHQ - 2 Score 0   THN CM Care Plan Problem One        Patient Outreach from 09/08/2014 in [place] Problem One  Type II DM, HTN and hyperlipidemia currently meeting treatment targets as evidenced by A1C= 6.4% on 08/03/14 and normal lipid profile results ( except HDL low at 37) on 10/28/13 and BP <140/90 on more than 75% of readings.   Care Plan for Problem One  Active   THN Long Term Goal (31-90 days)  Ongoing good control of Type II DM, hyperlipidemia and HTN as evidenced by meeting treatment targets at each assessment   [facility] Long Term Goal Start Date  09/08/14  Interventions for Problem One Long Term Goal  Using a picture representation, discussed the 8 core pathophysiologic deficits in Type II diabetes. Reviewed physiology of diabetes as a chronic progressive disease with the initial problem of insulin resistance in the muscle, liver and fat cells and then increased loss of beta cell function over time resulting in decreased insulin production, reviewed role of being overweight, especially abdominal (visceral) obesity, on insulin resistance, reviewed patient medications, reviewed DM medications of Januvia including the mechanism of action, common side effects, dosages and dosing schedule, reinforced importance of  taking all medications as prescribed, discussed the need for the use of a combination of DM medications to correct the pathophysiologic core deficits and to prevent or slow beta cell failure, discussed role of statins and blood pressure medicines in the treatment of diabetes,  reviewed effects of physical activity on glucose levels and HDL levels and long-term glucose control by improving insulin sensitivity and assisting with weight management and cardiovascular health, encouraged member to begin an exercise program, reviewed American Diabetic Association recommendations of 150 minutes of exercise per week including two sessions of resistance exercise weekly, reviewed patient's CBG readings, reviewed blood glucose monitoring and interpretation, reviewed recommended target ranges for pre-meal and post-meal, informed patient that True Metrix meter and supplies will replace the True Result in the Link To Wellness program,  reviewed definitions of hyperglycemia and hypoglycemia, assessed patient's hypoglycemia threshold and usual symptoms, reviewed causes, other symptoms, and hypoglycemia treatment options including the Rule of 15s,  discussed results of 08/03/14  A1C, A1C goal, and correlation to estimated average glucose, arranged for Link To Wellness follow up in 6 months     Assessment:   [employer] employee and Link To Wellness member with Type II DM, HTN and hyperlipidemia currently meeting treatment targets.  Plan:  RNCM to fax today's office visit note to Dr. Larose Kells. RNCM will meet twice yearly and as needed with patient per Link To Wellness program guidelines to assist with Type II DM self-management and assess patient's progress toward mutually set goals.  Barrington Ellison RN,CCM,CDE [place] Management Coordinator Link To Wellness Office Phone [phone] Office Fax [phone]

## 2014-09-11 ENCOUNTER — Encounter (INDEPENDENT_AMBULATORY_CARE_PROVIDER_SITE_OTHER): Payer: Self-pay

## 2014-09-11 ENCOUNTER — Ambulatory Visit (INDEPENDENT_AMBULATORY_CARE_PROVIDER_SITE_OTHER): Payer: 59 | Admitting: Family Medicine

## 2014-09-11 ENCOUNTER — Encounter: Payer: Self-pay | Admitting: Family Medicine

## 2014-09-11 VITALS — BP 150/91 | HR 71 | Ht 66.0 in | Wt 188.0 lb

## 2014-09-11 DIAGNOSIS — M654 Radial styloid tenosynovitis [de Quervain]: Secondary | ICD-10-CM
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
## 2014-09-11 NOTE — Patient Instructions (Signed)
I would continue using the brace only when working for 4 weeks. Follow up with me as needed otherwise.

## 2014-09-11 NOTE — Assessment & Plan Note (Signed)
Significant improvement following injection, bracing.  Would continue bracing only for 4 more weeks at work.  F/u prn.

## 2014-10-12 ENCOUNTER — Other Ambulatory Visit: Payer: Self-pay | Admitting: Internal Medicine

## 2014-11-04 ENCOUNTER — Other Ambulatory Visit: Payer: Self-pay

## 2014-11-04 MED ORDER — CARVEDILOL 6.25 MG PO TABS: 6.2500 mg | ORAL_TABLET | Freq: Two times a day (BID) | ORAL | Status: DC

## 2014-11-30 ENCOUNTER — Other Ambulatory Visit: Payer: Self-pay | Admitting: Internal Medicine

## 2014-12-09 ENCOUNTER — Encounter: Payer: Self-pay | Admitting: Internal Medicine

## 2014-12-09 ENCOUNTER — Ambulatory Visit (INDEPENDENT_AMBULATORY_CARE_PROVIDER_SITE_OTHER): Payer: 59 | Admitting: Internal Medicine

## 2014-12-09 VITALS — BP 132/86 | HR 67 | Temp 97.7°F | Ht 66.0 in | Wt 191.0 lb

## 2014-12-09 DIAGNOSIS — I10 Essential (primary) hypertension: Secondary | ICD-10-CM

## 2014-12-09 DIAGNOSIS — E785 Hyperlipidemia, unspecified: Secondary | ICD-10-CM

## 2014-12-09 DIAGNOSIS — Z1211 Encounter for screening for malignant neoplasm of colon: Secondary | ICD-10-CM

## 2014-12-09 DIAGNOSIS — Z09 Encounter for follow-up examination after completed treatment for conditions other than malignant neoplasm: Secondary | ICD-10-CM

## 2014-12-09 DIAGNOSIS — E119 Type 2 diabetes mellitus without complications: Secondary | ICD-10-CM

## 2014-12-09 DIAGNOSIS — Z1159 Encounter for screening for other viral diseases: Secondary | ICD-10-CM

## 2014-12-09 DIAGNOSIS — Z114 Encounter for screening for human immunodeficiency virus [HIV]: Secondary | ICD-10-CM

## 2014-12-09 LAB — LIPID PANEL
CHOLESTEROL: 149 mg/dL (ref 0–200)
HDL: 37.2 mg/dL — ABNORMAL LOW (ref >39.00)
LDL CALC: 94 mg/dL (ref 0–99)
NONHDL: 111.56
Total CHOL/HDL Ratio: 4
Triglycerides: 90 mg/dL (ref 0.0–149.0)
VLDL: 18 mg/dL (ref 0.0–40.0)

## 2014-12-09 LAB — HEMOGLOBIN A1C: HEMOGLOBIN A1C: 6.3 % (ref 4.6–6.5)

## 2014-12-09 LAB — HEPATITIS C ANTIBODY: HCV AB: NEGATIVE

## 2014-12-09 LAB — BASIC METABOLIC PANEL
BUN: 13 mg/dL (ref 6–23)
CHLORIDE: 101 meq/L (ref 96–112)
CO2: 32 mEq/L (ref 19–32)
CREATININE: 0.67 mg/dL (ref 0.40–1.50)
Calcium: 10 mg/dL (ref 8.4–10.5)
GFR: 159.68 mL/min (ref >60.00)
GLUCOSE: 133 mg/dL — AB (ref 70–99)
Potassium: 4 mEq/L (ref 3.5–5.1)
Sodium: 140 mEq/L (ref 135–145)

## 2014-12-09 LAB — HIV ANTIBODY (ROUTINE TESTING W REFLEX): HIV 1&2 Ab, 4th Generation: NONREACTIVE

## 2014-12-09 LAB — MICROALBUMIN / CREATININE URINE RATIO
Creatinine,U: 82.6 mg/dL
Microalb Creat Ratio: 0.8 mg/g (ref 0.0–30.0)

## 2014-12-09 MED ORDER — HYDROCHLOROTHIAZIDE 25 MG PO TABS: 12.5000 mg | ORAL_TABLET | Freq: Every day | ORAL | Status: DC

## 2014-12-09 MED ORDER — CARVEDILOL 6.25 MG PO TABS: 6.2500 mg | ORAL_TABLET | Freq: Two times a day (BID) | ORAL | Status: DC

## 2014-12-09 NOTE — Progress Notes (Signed)
Pre visit review using our clinic review tool, if applicable. No additional management support is needed unless otherwise documented below in the visit note. 

## 2014-12-09 NOTE — Patient Instructions (Signed)
Get your blood work before you leave    Check the  blood pressure   monthly   Be sure your blood pressure is between 110/65 and  145/85.  if it is consistently higher or lower, let me know   Next visit  for a physical exam in 4-5 months, no  Fasting  Please schedule an appointment at the front desk
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
## 2014-12-09 NOTE — Assessment & Plan Note (Signed)
DM: Seems well-controlled, on Januvia, if needed will rx metformin (at some point he took it before without side effects) HTN: Seems well-controlled, check a BMP. Refill Proventil Hyperlipidemia: On Lipitor, check FLP. Recent LFTs normal Tendinitis: Was recently seen with tendinitis, saw sports medicine, symptoms have significantly improved Primary care: Had a flu shot 2 weeks ago. RTC 4-5 months , physical

## 2015-01-04 ENCOUNTER — Other Ambulatory Visit: Payer: Self-pay | Admitting: Internal Medicine

## 2015-01-21 ENCOUNTER — Encounter: Payer: Self-pay | Admitting: Cardiology

## 2015-02-18 ENCOUNTER — Other Ambulatory Visit: Payer: Self-pay

## 2015-02-18 MED ORDER — ATORVASTATIN CALCIUM 20 MG PO TABS: 20.0000 mg | ORAL_TABLET | Freq: Every day | ORAL | Status: DC

## 2015-02-18 MED ORDER — SITAGLIPTIN PHOSPHATE 100 MG PO TABS: 100.0000 mg | ORAL_TABLET | Freq: Every day | ORAL | Status: DC

## 2015-02-18 MED FILL — JANUVIA 100 MG TABLET: 100 | 90 days supply | Qty: 90 | Fill #0

## 2015-02-18 MED FILL — ATORVASTATIN 20 MG TABLET: 20 | 90 days supply | Qty: 90 | Fill #0

## 2015-03-02 DIAGNOSIS — H524 Presbyopia: Secondary | ICD-10-CM | POA: Diagnosis not present

## 2015-03-02 LAB — HM DIABETES EYE EXAM

## 2015-03-08 ENCOUNTER — Encounter: Payer: Self-pay | Admitting: Internal Medicine

## 2015-03-09 MED FILL — AMLODIPINE BESYLATE 5 MG TA: 5 | 90 days supply | Qty: 90 | Fill #1

## 2015-03-09 MED FILL — HYDROCHLOROTHIAZIDE 25 MG T: 25 | 90 days supply | Qty: 45 | Fill #1

## 2015-03-11 ENCOUNTER — Ambulatory Visit: Payer: Self-pay | Admitting: *Deleted

## 2015-03-11 ENCOUNTER — Encounter: Payer: Self-pay | Admitting: *Deleted

## 2015-03-11 NOTE — Patient Outreach (Signed)
Todd Newman was a no show/no call for his appointment today 03/11/15 at 3:00 pm. A letter will be mailed to [facility] asking him to reschedule his appointment by 03/19/15. Barrington Ellison RN,CCM,CDE [place] Management Coordinator Link To Wellness Office Phone [phone] Office Fax [phone]

## 2015-03-25 MED FILL — CARVEDILOL 6.25 MG TABLET: 6.25 | 90 days supply | Qty: 180 | Fill #1

## 2015-03-31 ENCOUNTER — Other Ambulatory Visit: Payer: Self-pay | Admitting: *Deleted

## 2015-03-31 NOTE — Patient Outreach (Signed)
[facility]) Care Management   03/31/2015  Todd Newman Apr 24, 1960 [id number]  Todd Newman is an 55 y.o. male .who presents to the [facility] [employer] Care Management office for routine Link To Wellness follow up for self management assistance with Type II DM, HTN and hyperlipidemia.  Subjective:  Todd Newman has no complaints. Is requesting assistance with making his annual wellness exam appointment with Dr. Drue Newman. He last saw Dr. Drue Newman on 12/09/14.  Objective:   Review of Systems  Constitutional: Negative.     Physical Exam  Constitutional: He is oriented to person, place, and time. He appears well-developed and well-nourished.  Respiratory: Effort normal.  Neurological: He is alert and oriented to person, place, and time.  Skin: Skin is warm and dry.  Psychiatric: He has a normal mood and affect. His behavior is normal. Judgment and thought content normal.   Filed Vitals:   03/31/15 1547  BP: 122/80   Filed Weights   03/31/15 1547  Weight: 195 lb 3.2 oz (88.542 kg)    Current Medications:   Current Outpatient Prescriptions  Medication Sig Dispense Refill  . amLODipine (NORVASC) 5 MG tablet TAKE 1 TABLET BY MOUTH DAILY. 90 tablet 1  . atorvastatin (LIPITOR) 20 MG tablet Take 1 tablet (20 mg total) by mouth daily. 90 tablet 0  . Blood Glucose Monitoring Suppl (TRUERESULT BLOOD GLUCOSE) W/DEVICE KIT by Does not apply route. Check blood sugar daily DX:250.00     . carvedilol (COREG) 6.25 MG tablet Take 1 tablet (6.25 mg total) by mouth 2 (two) times daily with a meal. 180 tablet 1  . hydrochlorothiazide (HYDRODIURIL) 25 MG tablet Take 0.5 tablets (12.5 mg total) by mouth daily. 45 tablet 1  . ranitidine (ZANTAC) 150 MG tablet Take 1 tablet (150 mg total) by mouth 2 (two) times daily. 180 tablet 3  . sitaGLIPtin (JANUVIA) 100 MG tablet Take 1 tablet (100 mg total) by mouth daily. 90 tablet 0  . TRUETEST TEST test strip USE TO CHECK BLOOD  SUGAR ONCE DAILY. 100 each 11  . sildenafil (VIAGRA) 100 MG tablet Take one tablet by mouth as needed  as directed by your doctor 6 tablet 3   No current facility-administered medications for this visit.    Functional Status:   In your present state of health, do you have any difficulty performing the following activities: 03/31/2015 12/09/2014  Hearing? N N  Vision? N N  Difficulty concentrating or making decisions? N N  Walking or climbing stairs? N N  Dressing or bathing? N N  Doing errands, shopping? N N    Fall/Depression Screening:    PHQ 2/9 Scores 12/09/2014 09/08/2014  PHQ - 2 Score 0 0    Assessment:   [name] employee and Link To Wellness member with Type II DM, HTN and hyperlipidemia.  Plan:  [facility] CM Care Plan Problem One        Most Recent Value   Care Plan Problem One  Type II DM, HTN and hyperlipidemia currently meeting treatment targets as evidenced by Hgb A1C= 6.3% on 12/09/14 and normal lipid profile results ( except HDL low at 37) on 12/09/14 and BP <140/90 on more than 75% of readings.   Role Documenting the Problem One  Care Management Coordinator   Care Plan for Problem One  Active   THN Long Term Goal (31-90 days)  Ongoing good control of Type II DM, hyperlipidemia and HTN as evidenced by meeting treatment targets at each assessment  THN Long Term Goal Start Date  03/31/15   THN Long Term Goal Met Date  03/31/15   Interventions for Problem One Long Term Goal  reviewed patient medications, reviewed DM medications of Januvia including the mechanism of action, common side effects, dosages and dosing schedule, reinforced importance of taking all medications as prescribed, reviewed the role of statins and blood pressure medicines in the treatment of diabetes,  reviewed effects of physical activity on glucose levels and HDL levels and long-term glucose control by improving insulin sensitivity and assisting with weight management and cardiovascular health,  encouraged member to begin an exercise program, reviewed American Diabetic Association recommendations of 150 minutes of exercise per week including two sessions of resistance exercise weekly, reviewed patient's CBG readings, reviewed blood glucose monitoring and interpretation, reviewed recommended target ranges for pre-meal and post-meal, reviewed definitions of hyperglycemia and hypoglycemia, assessed patient's hypoglycemia threshold and usual symptoms, reviewed causes, other symptoms, and hypoglycemia treatment options including the Rule of 15s,  discussed results of 12/09/14 Hgb  A1C, Hgb A1C goal, and correlation to estimated average glucose, assisted Todd Newman with scheduling his appointment with Dr Todd Newman for his annual wellness exam via My Chart on 07/19/15, arranged for Link To Wellness follow up in July, provided [place] with a spiral bound calendar with the dates of his appointments marked and with corresponding office phone numbers should he need to reschedule the appointments      RNCM to fax today's office visit note to Dr. Larose Newman. RNCM will meet twice yearly and as needed with patient per Link To Wellness program guidelines to assist with Type II DM, HTN and hyperlipidemia self-management and assess patient's progress toward mutually set goals.  Barrington Ellison RN,CCM,CDE [place] Management Coordinator Link To Wellness Office Phone [phone] Office Fax [phone]

## 2015-04-01 ENCOUNTER — Encounter: Payer: Self-pay | Admitting: *Deleted

## 2015-04-20 MED FILL — raNITIdine HCL 150 MG TABS: 150 | 90 days supply | Qty: 180 | Fill #1

## 2015-05-24 ENCOUNTER — Other Ambulatory Visit: Payer: Self-pay | Admitting: Internal Medicine

## 2015-05-24 MED FILL — JANUVIA 100 MG TABLET: 100 | 90 days supply | Qty: 90 | Fill #0

## 2015-05-24 MED FILL — VIAGRA 100 MG TABLET: 100 | 30 days supply | Qty: 6 | Fill #0

## 2015-05-24 MED FILL — ATORVASTATIN 20 MG TABLET: 20 | 90 days supply | Qty: 90 | Fill #0

## 2015-06-08 MED FILL — TRUE METRIX GLUCOSE TEST ST: 90 days supply | Qty: 100 | Fill #1

## 2015-06-09 ENCOUNTER — Other Ambulatory Visit: Payer: Self-pay | Admitting: *Deleted

## 2015-06-09 MED ORDER — AMLODIPINE BESYLATE 5 MG PO TABS: 5.0000 mg | ORAL_TABLET | Freq: Every day | ORAL | Status: DC

## 2015-06-09 MED ORDER — HYDROCHLOROTHIAZIDE 25 MG PO TABS: 12.5000 mg | ORAL_TABLET | Freq: Every day | ORAL | Status: DC

## 2015-06-10 MED FILL — AMLODIPINE BESYLATE 5 MG TA: 5 | 90 days supply | Qty: 90 | Fill #0

## 2015-06-10 MED FILL — HYDROCHLOROTHIAZIDE 25 MG T: 25 | 90 days supply | Qty: 45 | Fill #0

## 2015-06-29 ENCOUNTER — Other Ambulatory Visit: Payer: Self-pay

## 2015-06-29 MED ORDER — CARVEDILOL 6.25 MG PO TABS: 6.2500 mg | ORAL_TABLET | Freq: Two times a day (BID) | ORAL | Status: DC

## 2015-06-29 MED FILL — CARVEDILOL 6.25 MG TABLET: 6.25 | 90 days supply | Qty: 180 | Fill #0

## 2015-07-19 ENCOUNTER — Encounter: Payer: Self-pay | Admitting: Internal Medicine

## 2015-07-19 ENCOUNTER — Ambulatory Visit (INDEPENDENT_AMBULATORY_CARE_PROVIDER_SITE_OTHER): Payer: 59 | Admitting: Internal Medicine

## 2015-07-19 VITALS — BP 124/76 | HR 73 | Temp 98.0°F | Ht 66.0 in | Wt 194.4 lb

## 2015-07-19 DIAGNOSIS — Z23 Encounter for immunization: Secondary | ICD-10-CM | POA: Diagnosis not present

## 2015-07-19 DIAGNOSIS — Z1211 Encounter for screening for malignant neoplasm of colon: Secondary | ICD-10-CM

## 2015-07-19 DIAGNOSIS — E119 Type 2 diabetes mellitus without complications: Secondary | ICD-10-CM | POA: Diagnosis not present

## 2015-07-19 DIAGNOSIS — Z0000 Encounter for general adult medical examination without abnormal findings: Secondary | ICD-10-CM

## 2015-07-19 NOTE — Progress Notes (Signed)
Pre visit review using our clinic review tool, if applicable. No additional management support is needed unless otherwise documented below in the visit note. 

## 2015-07-19 NOTE — Assessment & Plan Note (Addendum)
Td 2010; Pneumonia shot 2015; prevnar today.   + FHf heart disease, plan is to control cardiovascular risk factors. On  ASA  Colon cancer screening discussed, never had a colonoscopy,  failure to obtain fecal tests before, explained benefits of a colonoscopy and he agreed. Refer to GI. Prostate cancer screening, DRE-PSA 01-2014 wnl  Diet and exercise discussed

## 2015-07-19 NOTE — Patient Instructions (Signed)
GO TO THE LAB : Get the blood work     GO TO THE FRONT DESK Schedule your next appointment for a  routine checkup in 6 months, fasting   deje de tomar ranitidina  empieze aspirina 81 mg: 1 tableta al dia con el desayuno
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
## 2015-07-20 LAB — BASIC METABOLIC PANEL
BUN: 15 mg/dL (ref 6–23)
CALCIUM: 9.4 mg/dL (ref 8.4–10.5)
CO2: 32 mEq/L (ref 19–32)
CREATININE: 1 mg/dL (ref 0.40–1.50)
Chloride: 101 mEq/L (ref 96–112)
GFR: 100.35 mL/min (ref >60.00)
GLUCOSE: 118 mg/dL — AB (ref 70–99)
Potassium: 3.5 mEq/L (ref 3.5–5.1)
SODIUM: 139 meq/L (ref 135–145)

## 2015-07-20 LAB — CBC WITH DIFFERENTIAL/PLATELET
BASOS PCT: 0.6 % (ref 0.0–3.0)
Basophils Absolute: 0 10*3/uL (ref 0.0–0.1)
EOS ABS: 0.2 10*3/uL (ref 0.0–0.7)
EOS PCT: 2.7 % (ref 0.0–5.0)
HCT: 39.2 % (ref 39.0–52.0)
Hemoglobin: 13.3 g/dL (ref 13.0–17.0)
LYMPHS ABS: 1.8 10*3/uL (ref 0.7–4.0)
Lymphocytes Relative: 28.3 % (ref 12.0–46.0)
MCHC: 33.8 g/dL (ref 30.0–36.0)
MCV: 89.2 fl (ref 78.0–100.0)
MONO ABS: 0.9 10*3/uL (ref 0.1–1.0)
Monocytes Relative: 13.9 % — ABNORMAL HIGH (ref 3.0–12.0)
NEUTROS ABS: 3.5 10*3/uL (ref 1.4–7.7)
Neutrophils Relative %: 54.5 % (ref 43.0–77.0)
PLATELETS: 307 10*3/uL (ref 150.0–400.0)
RBC: 4.4 Mil/uL (ref 4.22–5.81)
RDW: 13.6 % (ref 11.5–15.5)
WBC: 6.5 10*3/uL (ref 4.0–10.5)

## 2015-07-20 LAB — ALT: ALT: 38 U/L (ref 0–53)

## 2015-07-20 LAB — HEMOGLOBIN A1C: HEMOGLOBIN A1C: 6.3 % (ref 4.6–6.5)

## 2015-07-20 LAB — AST: AST: 23 U/L (ref 0–37)

## 2015-07-22 NOTE — Assessment & Plan Note (Signed)
DM: Continue Januvia, check A1c. HTN: Continue amlodipine, carvedilol, HCTZ. Well-controlled High cholesterol: On Lipitor, last FLP satisfactory GERD: Largely asx, DC ranitidine RTC 6 months

## 2015-07-29 ENCOUNTER — Ambulatory Visit: Payer: 59 | Admitting: *Deleted

## 2015-08-04 ENCOUNTER — Encounter: Payer: Self-pay | Admitting: Gastroenterology

## 2015-08-26 ENCOUNTER — Ambulatory Visit: Payer: Self-pay | Admitting: *Deleted

## 2015-08-26 ENCOUNTER — Other Ambulatory Visit: Payer: Self-pay | Admitting: Internal Medicine

## 2015-08-27 ENCOUNTER — Other Ambulatory Visit: Payer: Self-pay | Admitting: *Deleted

## 2015-08-27 MED FILL — ATORVASTATIN 20 MG TABLET: 20 | 90 days supply | Qty: 90 | Fill #0 | Status: TO

## 2015-08-27 MED FILL — JANUVIA 100 MG TABLET: 100 | 90 days supply | Qty: 90 | Fill #0 | Status: TO

## 2015-08-27 NOTE — Patient Outreach (Signed)
Eythan was a no call/no show for his Link To Wellness appointment on 7/13 at 3:45 pm. Attempted to page him on his work pager with no response. Barrington Ellison RN,CCM,CDE [place] Management Coordinator Link To Wellness Office Phone [phone] Office Fax [phone]

## 2015-09-02 ENCOUNTER — Encounter: Payer: Self-pay | Admitting: *Deleted

## 2015-09-02 ENCOUNTER — Other Ambulatory Visit: Payer: Self-pay | Admitting: Internal Medicine

## 2015-09-02 ENCOUNTER — Ambulatory Visit (AMBULATORY_SURGERY_CENTER): Payer: Self-pay | Admitting: *Deleted

## 2015-09-02 VITALS — Ht 66.0 in | Wt 187.8 lb

## 2015-09-02 DIAGNOSIS — Z1211 Encounter for screening for malignant neoplasm of colon: Secondary | ICD-10-CM

## 2015-09-02 MED ORDER — NA SULFATE-K SULFATE-MG SULF 17.5-3.13-1.6 GM/177ML PO SOLN: 1.0000 | Freq: Once | ORAL | Status: DC

## 2015-09-02 MED FILL — SUPREP BOWEL PREP KIT: 17.5-3.13-1 | 1 days supply | Qty: 354 | Fill #0

## 2015-09-02 MED FILL — HYDROCHLOROTHIAZIDE 25 MG T: 25 | 90 days supply | Qty: 45 | Fill #0 | Status: TO

## 2015-09-02 NOTE — Patient Outreach (Signed)
This [facility] has attempted unsuccessfully to reach [facility] via his work pager to reschedule his [facility] To Wellness appointment that was missed on 7/13. A letter will be mailed to his home today requesting he schedule an appointment or risk termination form the program. Barrington Ellison RN,CCM,CDE [place] Management Coordinator Link To Wellness Office Phone [phone] Office Fax [phone]

## 2015-09-02 NOTE — Progress Notes (Signed)
No egg or soy allergy known to patient  No issues with past sedation with any surgeries or procedures, no past  intubation   No diet pills per patient No home 02 use per patient  No blood thinners per patient  Pt denies issues with constipation  emmi video declined

## 2015-09-08 ENCOUNTER — Other Ambulatory Visit: Payer: Self-pay | Admitting: Internal Medicine

## 2015-09-08 MED FILL — AMLODIPINE BESYLATE 5 MG TA: 5 | 90 days supply | Qty: 90 | Fill #0 | Status: TO

## 2015-09-16 ENCOUNTER — Ambulatory Visit (AMBULATORY_SURGERY_CENTER): Payer: 59 | Admitting: Gastroenterology

## 2015-09-16 ENCOUNTER — Encounter: Payer: Self-pay | Admitting: Gastroenterology

## 2015-09-16 VITALS — BP 136/90 | HR 67 | Temp 97.8°F | Resp 18 | Ht 66.0 in | Wt 187.0 lb

## 2015-09-16 DIAGNOSIS — K621 Rectal polyp: Secondary | ICD-10-CM | POA: Diagnosis not present

## 2015-09-16 DIAGNOSIS — D129 Benign neoplasm of anus and anal canal: Secondary | ICD-10-CM

## 2015-09-16 DIAGNOSIS — D125 Benign neoplasm of sigmoid colon: Secondary | ICD-10-CM | POA: Diagnosis not present

## 2015-09-16 DIAGNOSIS — Z1211 Encounter for screening for malignant neoplasm of colon: Secondary | ICD-10-CM | POA: Diagnosis present

## 2015-09-16 DIAGNOSIS — K635 Polyp of colon: Secondary | ICD-10-CM | POA: Diagnosis not present

## 2015-09-16 DIAGNOSIS — D128 Benign neoplasm of rectum: Secondary | ICD-10-CM

## 2015-09-16 MED ORDER — SODIUM CHLORIDE 0.9 % IV SOLN: 500.0000 mL | INTRAVENOUS | Status: DC

## 2015-09-16 NOTE — Op Note (Signed)
[facility] Patient Name: Alejandro Ajello Procedure Date: 09/16/2015 8:19 AM MRN: [id number] Endoscopist: Mallie Mussel L. Loletha Carrow , MD Age: 55 Referring MD:  Date of Birth: 12-09-1960 Gender: Male Account #: [id number] Procedure:                Colonoscopy Indications:              Screening for colorectal malignant neoplasm, This                            is the patient's first colonoscopy Medicines:                Monitored Anesthesia Care Procedure:                Pre-Anesthesia Assessment:                           - Prior to the procedure, a History and Physical                            was performed, and patient medications and                            allergies were reviewed. The patient's tolerance of                            previous anesthesia was also reviewed. The risks                            and benefits of the procedure and the sedation                            options and risks were discussed with the patient.                            All questions were answered, and informed consent                            was obtained. Prior Anticoagulants: The patient has                            taken no previous anticoagulant or antiplatelet                            agents. ASA Grade Assessment: II - A patient with                            mild systemic disease. After reviewing the risks                            and benefits, the patient was deemed in                            satisfactory condition to undergo the procedure.  After obtaining informed consent, the colonoscope                            was passed under direct vision. Throughout the                            procedure, the patient's blood pressure, pulse, and                            oxygen saturations were monitored continuously. The                            Model CF-HQ190L [id number]) scope was introduced                            through the anus and  advanced to the the cecum,                            identified by appendiceal orifice and ileocecal                            valve. The colonoscopy was performed without                            difficulty. The patient tolerated the procedure                            well. The quality of the bowel preparation was fair                            (it is not clear that the patient followed                            preparation instructions properly). The ileocecal                            valve, appendiceal orifice, and rectum were                            photographed. The bowel preparation used was SUPREP. Scope In: 8:29:16 AM Scope Out: 8:47:39 AM Scope Withdrawal Time: 0 hours 16 minutes 1 second  Total Procedure Duration: 0 hours 18 minutes 23 seconds  Findings:                 The perianal and digital rectal examinations were                            normal.                           Two sessile polyps were found in the rectum and mid                            sigmoid colon. The polyps were 2 to 4 mm in size.  These polyps were removed with a cold snare.                            Resection and retrieval were complete.                           Diverticula were found in the mid ascending colon                            and left colon.                           Internal hemorrhoids were found during                            retroflexion. The hemorrhoids were Grade I                            (internal hemorrhoids that do not prolapse).                           The exam was otherwise without abnormality on                            direct and retroflexion views. Complications:            No immediate complications. Estimated Blood Loss:     Estimated blood loss: none. Impression:               - Preparation of the colon was fair.                           - Two 2 to 4 mm polyps in the rectum and in the mid                             sigmoid colon, removed with a cold snare. Resected                            and retrieved.                           - Diverticulosis in the mid ascending colon and in                            the left colon.                           - Internal hemorrhoids.                           - The examination was otherwise normal on direct                            and retroflexion views. Recommendation:           - Patient has a contact number available for  emergencies. The signs and symptoms of potential                            delayed complications were discussed with the                            patient. Return to normal activities tomorrow.                            Written discharge instructions were provided to the                            patient.                           - Resume previous diet.                           - Continue present medications.                           - Await pathology results.                           - Repeat colonoscopy in 5 years for surveillance                            (suboptimal prep). Henry L. Loletha Carrow, MD 09/16/2015 8:52:58 AM This report has been signed electronically.

## 2015-09-16 NOTE — Progress Notes (Signed)
No egg or soy allergy known to patient  No issues with past sedation with any surgeries  or procedures, no intubation problems  No diet pills per patient No home 02 use per patient  No blood thinners per patient  Pt denies issues with constipation   

## 2015-09-16 NOTE — Progress Notes (Signed)
Report to PACU, RN, vss, BBS= Clear.  

## 2015-09-16 NOTE — Progress Notes (Signed)
Called to room to assist during endoscopic procedure.  Patient ID and intended procedure confirmed with present staff. Received instructions for my participation in the procedure from the performing physician.  

## 2015-09-16 NOTE — Patient Instructions (Signed)
YOU HAD AN ENDOSCOPIC PROCEDURE TODAY AT [facility]:   Refer to the procedure report that was given to you for any specific questions about what was found during the examination.  If the procedure report does not answer your questions, please call your gastroenterologist to clarify.  If you requested that your care partner not be given the details of your procedure findings, then the procedure report has been included in a sealed envelope for you to review at your convenience later.  YOU SHOULD EXPECT: Some feelings of bloating in the abdomen. Passage of more gas than usual.  Walking can help get rid of the air that was put into your GI tract during the procedure and reduce the bloating. If you had a lower endoscopy (such as a colonoscopy or flexible sigmoidoscopy) you may notice spotting of blood in your stool or on the toilet paper. If you underwent a bowel prep for your procedure, you may not have a normal bowel movement for a few days.  Please Note:  You might notice some irritation and congestion in your nose or some drainage.  This is from the oxygen used during your procedure.  There is no need for concern and it should clear up in a day or so.  SYMPTOMS TO REPORT IMMEDIATELY:   Following lower endoscopy (colonoscopy or flexible sigmoidoscopy):  Excessive amounts of blood in the stool  Significant tenderness or worsening of abdominal pains  Swelling of the abdomen that is new, acute  Fever of 100F or higher  For urgent or emergent issues, a gastroenterologist can be reached at any hour by calling [phone].   DIET: Your first meal following the procedure should be a small meal and then it is ok to progress to your normal diet. Heavy or fried foods are harder to digest and may make you feel nauseous or bloated.  Likewise, meals heavy in dairy and vegetables can increase bloating.  Drink plenty of fluids but you should avoid alcoholic beverages for 24  hours.  ACTIVITY:  You should plan to take it easy for the rest of today and you should NOT DRIVE or use heavy machinery until tomorrow (because of the sedation medicines used during the test).    FOLLOW UP: Our staff will call the number listed on your records the next business day following your procedure to check on you and address any questions or concerns that you may have regarding the information given to you following your procedure. If we do not reach you, we will leave a message.  However, if you are feeling well and you are not experiencing any problems, there is no need to return our call.  We will assume that you have returned to your regular daily activities without incident.  If any biopsies were taken you will be contacted by phone or by letter within the next 1-3 weeks.  Please call us at [phone] if you have not heard about the biopsies in 3 weeks.    SIGNATURES/CONFIDENTIALITY: You and/or your care partner have signed paperwork which will be entered into your electronic medical record.  These signatures attest to the fact that that the information above on your After Visit Summary has been reviewed and is understood.  Full responsibility of the confidentiality of this discharge information lies with you and/or your care-partner.  Please read polyp, diverticulosis, high fiber diet and hemorrhoid handouts provided. Next colonoscopy in 5 years.

## 2015-09-17 ENCOUNTER — Telehealth: Payer: Self-pay | Admitting: *Deleted

## 2015-09-17 NOTE — Telephone Encounter (Signed)
No answer, left message to call if questions or concerns. 

## 2015-09-22 ENCOUNTER — Encounter: Payer: Self-pay | Admitting: Gastroenterology

## 2015-09-30 ENCOUNTER — Other Ambulatory Visit: Payer: Self-pay | Admitting: *Deleted

## 2015-09-30 NOTE — Patient Outreach (Signed)
Returned  call to [name] and arranged Link To Wellness appointment on 8/24 at 4:00 pm. Barrington Ellison RN,CCM,CDE [facility] Management Coordinator Link To Wellness Office Phone [phone] Office Fax [phone]

## 2015-10-07 ENCOUNTER — Other Ambulatory Visit: Payer: Self-pay | Admitting: *Deleted

## 2015-10-11 NOTE — Patient Outreach (Signed)
[facility]) Care Management   10/07/2015  Todd Newman 16-Jan-1961 [id number]  Todd Newman is an 55 y.o. male who presents to the [facility] Management office for routine Link To Wellness follow up for self management assistance with Type II DM, HTN, hyperlipidemia and overweight.  Subjective: Todd Newman has no complaints. States he continues to check his blood sugars in the morning two times weekly and reports a fasting variance of 100-129. When questioned about his 4 lb weight loss he attributes it to work as he has a physically active job in [employer]. His medical record indicates he had an annual wellness exam on 07/17/15 and had a colonoscopy on 8/3. He reports that he is up to date on his health maintenance checks.   Objective:   Review of Systems  Constitutional: Negative.     Physical Exam  Constitutional: He is oriented to person, place, and time. He appears well-developed and well-nourished.  Respiratory: Effort normal.  Neurological: He is alert and oriented to person, place, and time.  Skin: Skin is warm and dry.  Psychiatric: He has a normal mood and affect. His behavior is normal. Judgment and thought content normal.   Filed Weights   10/07/15 1555  Weight: 190 lb 6.4 oz (86.4 kg)   Vitals:   10/07/15 1555  BP: 138/80   Encounter Medications:   Outpatient Encounter Prescriptions as of 10/07/2015  Medication Sig  . amLODipine (NORVASC) 5 MG tablet Take 1 tablet (5 mg total) by mouth daily.  Marland Kitchen aspirin 81 MG tablet Take 81 mg by mouth daily.  Marland Kitchen atorvastatin (LIPITOR) 20 MG tablet Take 1 tablet (20 mg total) by mouth daily.  . Blood Glucose Monitoring Suppl (TRUERESULT BLOOD GLUCOSE) W/DEVICE KIT by Does not apply route. Reported on 07/19/2015  . carvedilol (COREG) 6.25 MG tablet Take 1 tablet (6.25 mg total) by mouth 2 (two) times daily with a meal.  . hydrochlorothiazide (HYDRODIURIL) 25 MG tablet Take  0.5 tablets (12.5 mg total) by mouth daily.  . sildenafil (VIAGRA) 100 MG tablet Take 1 tablet (100 mg total) by mouth daily as needed for erectile dysfunction.  . sitaGLIPtin (JANUVIA) 100 MG tablet Take 1 tablet (100 mg total) by mouth daily.  . TRUETEST TEST test strip USE TO CHECK BLOOD SUGAR ONCE DAILY.   Facility-Administered Encounter Medications as of 10/07/2015  Medication  . 0.9 %  sodium chloride infusion    Functional Status:   In your present state of health, do you have any difficulty performing the following activities: 07/19/2015 03/31/2015  Hearing? N N  Vision? N N  Difficulty concentrating or making decisions? N N  Walking or climbing stairs? N N  Dressing or bathing? N N  Doing errands, shopping? N N  Some recent data might be hidden    Fall/Depression Screening:    PHQ 2/9 Scores 07/19/2015 12/09/2014 09/08/2014  PHQ - 2 Score 0 0 0    Assessment:   [employer] employee with Type II DM in good control as evidenced by home CBG readings and Hgb A1C , with HTN and hyperlipidemia also in good control. Overweight with body mass index today of 28.2  Plan:  [facility] CM Care Plan Problem One   Flowsheet Row Most Recent Value  Care Plan Problem One Type II DM, HTN and hyperlipidemia currently meeting treatment targets as evidenced by A1C= 6.3% on 07/19/15 and normal lipid profile results ( except HDL low at 37) on 12/09/14 and  BP <140/90 for all readings, overweight but with weight loss of 4 lbs since last Link To Wellness visit 10 months ago  Role Documenting the Problem One  Care Management [place] for Problem One  Active  THN Long Term Goal (31-90 days) Ongoing good control of Type II DM, hyperlipidemia and HTN as evidenced by meeting treatment targets at each assessment, and ongoing weight loss or no weight gain with goal of normal body mass index <25  THN Long Term Goal Start Date  10/07/15  THN Long Term Goal Met Date   Interventions for Problem One Long Term  Goal updated health history to include screening colonoscopy on 8/3 with two benign polyps, to repeat in 5 years,reviewed patient medications, reviewed DM medications of Januvia including the mechanism of action, common side effects, dosages and dosing schedule, reinforced importance of taking all medications as prescribed, reviewed the role of statins, low dose aspirin and blood pressure medicines in the treatment of diabetes,  reviewed effects of physical activity on glucose levels and HDL levels and long-term glucose control by improving insulin sensitivity and assisting with weight management and cardiovascular health, encouraged member to continue walking with goal of 150 minutes of exercise per week including two sessions of resistance exercise weekly, reviewed patient's CBG readings, reviewed fasting CBG target,  discussed results of 07/19/15 Hgb A1C, Hgb A1C goal, and correlation to estimated average glucose, discussed results of 8/2 colonoscopy and timing of repeating procedure,  Discussed changes to Link To Wellness program for 2018 and encouraged Todd Newman to attend the [employer] on site benefit fair in October, arranged for Link To Wellness follow up in Dec 2017     RNCM to fax today's office visit note to Dr. Larose Kells. RNCM will meet twice yearly and as needed with patient to assist with Type II DM, HTN, hyperlipidemia and weight self-management and assess patient's progress toward mutually set goals.  Barrington Ellison RN,CCM,CDE [facility] Management Coordinator Link To Wellness Office Phone [phone] Office Fax [phone]

## 2015-10-13 ENCOUNTER — Emergency Department (HOSPITAL_COMMUNITY)
Admission: EM | Admit: 2015-10-13 | Discharge: 2015-10-13 | Disposition: A | Discharge status: Home / Self Care | Payer: 59 | Attending: Emergency Medicine | Admitting: Emergency Medicine

## 2015-10-13 ENCOUNTER — Emergency Department (HOSPITAL_COMMUNITY): Payer: 59

## 2015-10-13 ENCOUNTER — Encounter (HOSPITAL_COMMUNITY): Payer: Self-pay

## 2015-10-13 DIAGNOSIS — Z79899 Other long term (current) drug therapy: Secondary | ICD-10-CM | POA: Diagnosis not present

## 2015-10-13 DIAGNOSIS — R509 Fever, unspecified: Secondary | ICD-10-CM

## 2015-10-13 DIAGNOSIS — J189 Pneumonia, unspecified organism: Secondary | ICD-10-CM | POA: Insufficient documentation

## 2015-10-13 DIAGNOSIS — I10 Essential (primary) hypertension: Secondary | ICD-10-CM | POA: Diagnosis not present

## 2015-10-13 DIAGNOSIS — R05 Cough: Secondary | ICD-10-CM | POA: Diagnosis not present

## 2015-10-13 DIAGNOSIS — Z7984 Long term (current) use of oral hypoglycemic drugs: Secondary | ICD-10-CM | POA: Diagnosis not present

## 2015-10-13 DIAGNOSIS — Z7982 Long term (current) use of aspirin: Secondary | ICD-10-CM | POA: Diagnosis not present

## 2015-10-13 DIAGNOSIS — Z87891 Personal history of nicotine dependence: Secondary | ICD-10-CM | POA: Diagnosis not present

## 2015-10-13 DIAGNOSIS — E119 Type 2 diabetes mellitus without complications: Secondary | ICD-10-CM | POA: Insufficient documentation

## 2015-10-13 LAB — URINALYSIS, ROUTINE W REFLEX MICROSCOPIC
BILIRUBIN URINE: NEGATIVE
Glucose, UA: NEGATIVE mg/dL
HGB URINE DIPSTICK: NEGATIVE
KETONES UR: NEGATIVE mg/dL
Leukocytes, UA: NEGATIVE
NITRITE: NEGATIVE
PROTEIN: NEGATIVE mg/dL
Specific Gravity, Urine: 1.016 (ref 1.005–1.030)
pH: 7 (ref 5.0–8.0)

## 2015-10-13 LAB — CBC WITH DIFFERENTIAL/PLATELET
BASOS PCT: 0 %
Basophils Absolute: 0 10*3/uL (ref 0.0–0.1)
EOS ABS: 0.1 10*3/uL (ref 0.0–0.7)
EOS PCT: 1 %
HCT: 40.7 % (ref 39.0–52.0)
Hemoglobin: 14.1 g/dL (ref 13.0–17.0)
LYMPHS ABS: 1.5 10*3/uL (ref 0.7–4.0)
Lymphocytes Relative: 15 %
MCH: 30.6 pg (ref 26.0–34.0)
MCHC: 34.6 g/dL (ref 30.0–36.0)
MCV: 88.3 fL (ref 78.0–100.0)
MONOS PCT: 10 %
Monocytes Absolute: 1 10*3/uL (ref 0.1–1.0)
Neutro Abs: 7.1 10*3/uL (ref 1.7–7.7)
Neutrophils Relative %: 74 %
PLATELETS: 301 10*3/uL (ref 150–400)
RBC: 4.61 MIL/uL (ref 4.22–5.81)
RDW: 13.6 % (ref 11.5–15.5)
WBC: 9.7 10*3/uL (ref 4.0–10.5)

## 2015-10-13 LAB — COMPREHENSIVE METABOLIC PANEL
ALK PHOS: 42 U/L (ref 38–126)
ALT: 34 U/L (ref 17–63)
AST: 23 U/L (ref 15–41)
Albumin: 4.4 g/dL (ref 3.5–5.0)
Anion gap: 9 (ref 5–15)
BUN: 13 mg/dL (ref 6–20)
CALCIUM: 9.4 mg/dL (ref 8.9–10.3)
CHLORIDE: 98 mmol/L — AB (ref 101–111)
CO2: 29 mmol/L (ref 22–32)
CREATININE: 0.88 mg/dL (ref 0.61–1.24)
Glucose, Bld: 132 mg/dL — ABNORMAL HIGH (ref 65–99)
Potassium: 3.7 mmol/L (ref 3.5–5.1)
Sodium: 136 mmol/L (ref 135–145)
Total Bilirubin: 0.6 mg/dL (ref 0.3–1.2)
Total Protein: 8.8 g/dL — ABNORMAL HIGH (ref 6.5–8.1)

## 2015-10-13 LAB — I-STAT CG4 LACTIC ACID, ED: LACTIC ACID, VENOUS: 1.02 mmol/L (ref 0.5–1.9)

## 2015-10-13 LAB — CBG MONITORING, ED: Glucose-Capillary: 110 mg/dL — ABNORMAL HIGH (ref 65–99)

## 2015-10-13 MED ORDER — SODIUM CHLORIDE 0.9 % IV SOLN
1000.0000 mL | INTRAVENOUS | Status: DC
  Administered 2015-10-13: 1000 mL via INTRAVENOUS

## 2015-10-13 MED ORDER — KETOROLAC TROMETHAMINE 30 MG/ML IJ SOLN
30.0000 mg | Freq: Once | INTRAMUSCULAR | Status: AC
Start: 2015-10-13 — End: 2015-10-13
  Administered 2015-10-13: 30 mg via INTRAVENOUS
  Filled 2015-10-13: qty 1

## 2015-10-13 MED ORDER — ONDANSETRON HCL 4 MG PO TABS: 4.0000 mg | ORAL_TABLET | Freq: Four times a day (QID) | ORAL | 0 refills | Status: DC

## 2015-10-13 MED ORDER — DEXTROSE 5 % IV SOLN
1.0000 g | Freq: Once | INTRAVENOUS | Status: AC
  Administered 2015-10-13: 1 g via INTRAVENOUS
  Filled 2015-10-13: qty 10

## 2015-10-13 MED ORDER — SODIUM CHLORIDE 0.9 % IV BOLUS (SEPSIS): 1000.0000 mL | Freq: Once | INTRAVENOUS | Status: DC

## 2015-10-13 MED ORDER — AMOXICILLIN-POT CLAVULANATE ER 1000-62.5 MG PO TB12: 2.0000 | ORAL_TABLET | Freq: Two times a day (BID) | ORAL | 0 refills | Status: DC

## 2015-10-13 MED ORDER — AZITHROMYCIN 250 MG PO TABS: 250.0000 mg | ORAL_TABLET | Freq: Every day | ORAL | 0 refills | Status: DC

## 2015-10-13 MED ORDER — ONDANSETRON HCL 4 MG/2ML IJ SOLN
4.0000 mg | Freq: Once | INTRAMUSCULAR | Status: AC
  Administered 2015-10-13: 4 mg via INTRAVENOUS
  Filled 2015-10-13: qty 2

## 2015-10-13 MED ORDER — SODIUM CHLORIDE 0.9 % IV BOLUS (SEPSIS)
1000.0000 mL | Freq: Once | INTRAVENOUS | Status: AC
  Administered 2015-10-13: 1000 mL via INTRAVENOUS

## 2015-10-13 MED ORDER — ACETAMINOPHEN 325 MG PO TABS
650.0000 mg | ORAL_TABLET | Freq: Once | ORAL | Status: AC | PRN
  Administered 2015-10-13: 650 mg via ORAL
  Filled 2015-10-13: qty 2
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
## 2015-10-13 NOTE — ED Triage Notes (Signed)
Pt states generalized body aches, cough since Sunday night.  Denies fever.  Cold-like symptoms.  Pt has taken tylenol.

## 2015-10-14 ENCOUNTER — Telehealth: Payer: Self-pay | Admitting: *Deleted

## 2015-10-14 ENCOUNTER — Other Ambulatory Visit: Payer: Self-pay | Admitting: Internal Medicine

## 2015-10-14 MED FILL — AZITHROMYCIN 250 MG TABLET: 250 | 5 days supply | Qty: 6 | Fill #0

## 2015-10-14 MED FILL — ONDANSETRON HCL 4 MG TABLET: 4 | 2 days supply | Qty: 6 | Fill #0

## 2015-10-14 MED FILL — CARVEDILOL 6.25 MG TABLET: 6.25 | 90 days supply | Qty: 180 | Fill #0 | Status: TO

## 2015-10-14 NOTE — Telephone Encounter (Signed)
Pharmacy called related to Rx: amoxicillin-clavulanate (AUGMENTIN XR) 1000-62.5 MG 12 hr not being available in pharmacy; however as suggestion to change to another Rx, stock was found.

## 2015-10-15 LAB — URINE CULTURE

## 2015-10-18 ENCOUNTER — Telehealth: Payer: Self-pay | Admitting: Internal Medicine

## 2015-10-18 LAB — CULTURE, BLOOD (ROUTINE X 2)
CULTURE: NO GROWTH
CULTURE: NO GROWTH

## 2015-10-18 NOTE — Telephone Encounter (Signed)
Went to the ER, DX with pneumonia. Please call and see how he is doing, likely need a follow-up. If needed I can talk to him due to a language barrier. Just let me know

## 2015-10-19 ENCOUNTER — Ambulatory Visit (INDEPENDENT_AMBULATORY_CARE_PROVIDER_SITE_OTHER): Payer: 59 | Admitting: Internal Medicine

## 2015-10-19 ENCOUNTER — Encounter: Payer: Self-pay | Admitting: Internal Medicine

## 2015-10-19 ENCOUNTER — Telehealth: Payer: Self-pay

## 2015-10-19 VITALS — BP 110/74 | HR 74 | Temp 98.1°F | Resp 14 | Ht 66.0 in | Wt 188.4 lb

## 2015-10-19 DIAGNOSIS — J189 Pneumonia, unspecified organism: Secondary | ICD-10-CM

## 2015-10-19 NOTE — Telephone Encounter (Signed)
Pt seen in office today for follow-up w/ PCP.
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
## 2015-10-19 NOTE — Telephone Encounter (Signed)
Received fax confirmation on 10/19/2015 at 1557.

## 2015-10-19 NOTE — Progress Notes (Signed)
Pre visit review using our clinic review tool, if applicable. No additional management support is needed unless otherwise documented below in the visit note. 

## 2015-10-19 NOTE — Telephone Encounter (Signed)
FMLA paperwork completed today during OV. Forms faxed to [employer] at [phone]. Copy given to Pt, and original sent for scanning.

## 2015-10-19 NOTE — Patient Instructions (Addendum)
  GO TO THE LAB : Get the blood work   Make an appointment at the front:  Check up by 01-2016   Come back to the first floor 11-03-2015 and get a follow up chest XR     --------------   termine los antibioticos  Tome mucinex DM 1 pastilla dos veces al dia por 10 dias si tiene tos  si no se sigue mejorando: llame a la oficina  regrese a este edificio a cualquier hora el 63 de septiembre para tomarse una radiografia   puede regresar al trabajo el lunes 11.

## 2015-10-19 NOTE — Assessment & Plan Note (Signed)
Community acquired pneumonia: Continue Augmentin, Zithromax. Check a chest x-ray 11/03/2015. Extensive paper work, FMLA, provided to the patient, okay to return to work 10/25/2015 with no restrictions. BMP today due to chloride abnormality @ the ER RTC 01/2016 for a routine checkup.

## 2015-10-20 LAB — BASIC METABOLIC PANEL
BUN: 17 mg/dL (ref 6–23)
CHLORIDE: 102 meq/L (ref 96–112)
CO2: 32 mEq/L (ref 19–32)
Calcium: 9.6 mg/dL (ref 8.4–10.5)
Creatinine, Ser: 1 mg/dL (ref 0.40–1.50)
GFR: 100.26 mL/min (ref >60.00)
Glucose, Bld: 107 mg/dL — ABNORMAL HIGH (ref 70–99)
POTASSIUM: 4.3 meq/L (ref 3.5–5.1)
SODIUM: 140 meq/L (ref 135–145)

## 2015-11-03 ENCOUNTER — Ambulatory Visit (HOSPITAL_BASED_OUTPATIENT_CLINIC_OR_DEPARTMENT_OTHER)
Admission: RE | Admit: 2015-11-03 | Discharge: 2015-11-03 | Disposition: A | Discharge status: Home / Self Care | Payer: 59 | Source: Ambulatory Visit | Attending: Internal Medicine | Admitting: Internal Medicine

## 2015-11-03 DIAGNOSIS — Z8701 Personal history of pneumonia (recurrent): Secondary | ICD-10-CM | POA: Diagnosis not present

## 2015-11-03 DIAGNOSIS — R05 Cough: Secondary | ICD-10-CM | POA: Diagnosis not present

## 2015-11-03 DIAGNOSIS — J189 Pneumonia, unspecified organism: Secondary | ICD-10-CM

## 2015-12-06 MED FILL — HYDROCHLOROTHIAZIDE 25 MG T: 25 | 90 days supply | Qty: 45 | Fill #0

## 2015-12-06 MED FILL — JANUVIA 100 MG TABLET: 100 | 90 days supply | Qty: 90 | Fill #0

## 2015-12-06 MED FILL — ATORVASTATIN 20 MG TABLET: 20 | 90 days supply | Qty: 90 | Fill #0

## 2015-12-23 MED FILL — AMLODIPINE BESYLATE 5 MG TA: 5 | 90 days supply | Qty: 90 | Fill #0

## 2016-01-18 ENCOUNTER — Encounter: Payer: Self-pay | Admitting: Internal Medicine

## 2016-01-18 ENCOUNTER — Ambulatory Visit (INDEPENDENT_AMBULATORY_CARE_PROVIDER_SITE_OTHER): Payer: 59 | Admitting: Internal Medicine

## 2016-01-18 VITALS — BP 122/70 | HR 73 | Temp 97.3°F | Resp 12 | Ht 66.0 in | Wt 192.1 lb

## 2016-01-18 DIAGNOSIS — I10 Essential (primary) hypertension: Secondary | ICD-10-CM

## 2016-01-18 DIAGNOSIS — E785 Hyperlipidemia, unspecified: Secondary | ICD-10-CM

## 2016-01-18 DIAGNOSIS — E119 Type 2 diabetes mellitus without complications: Secondary | ICD-10-CM | POA: Diagnosis not present

## 2016-01-18 MED ORDER — HYDROCORTISONE 2.5 % EX CREA: TOPICAL_CREAM | Freq: Two times a day (BID) | CUTANEOUS | 2 refills | Status: DC

## 2016-01-18 NOTE — Patient Instructions (Addendum)
  GO TO THE FRONT DESK  Schedule fasting labs to be done within the next few days   Schedule your next appointment for a  Physical exam in 6 months    Use the cream twice a day as needed, call if no better

## 2016-01-18 NOTE — Progress Notes (Signed)
Pre visit review using our clinic review tool, if applicable. No additional management support is needed unless otherwise documented below in the visit note. 
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
## 2016-01-19 MED FILL — HYDROCORTISONE 2.5% CREAM: 2.5 | 10 days supply | Qty: 30 | Fill #0

## 2016-01-19 MED FILL — CARVEDILOL 6.25 MG TABLET: 6.25 | 90 days supply | Qty: 180 | Fill #0

## 2016-01-19 NOTE — Assessment & Plan Note (Signed)
PNM-- CXR 10-2015 wnl, now asymptomatic DM: On Januvia, check A1c and microalbumin. Foot exam negative today. High cholesterol: On Lipitor, most recent LFTs within normal, check FLP. HTN: Continue hydrochlorothiazide, carvedilol, amlodipine. Recent BMP satisfactory. Rash: Eczema?. Rx topical steroids. Call if no better Primary care: Had a flu shot.

## 2016-01-20 ENCOUNTER — Telehealth: Payer: Self-pay

## 2016-01-20 NOTE — Telephone Encounter (Signed)
Coding Query received from [facility]. Form completed and faxed to [phone]. Form sent for scanning.

## 2016-01-21 ENCOUNTER — Other Ambulatory Visit (INDEPENDENT_AMBULATORY_CARE_PROVIDER_SITE_OTHER): Payer: 59

## 2016-01-21 DIAGNOSIS — E119 Type 2 diabetes mellitus without complications: Secondary | ICD-10-CM | POA: Diagnosis not present

## 2016-01-21 LAB — MICROALBUMIN / CREATININE URINE RATIO
CREATININE, U: 83.7 mg/dL
MICROALB/CREAT RATIO: 0.8 mg/g (ref 0.0–30.0)

## 2016-01-21 LAB — LIPID PANEL
CHOLESTEROL: 141 mg/dL (ref 0–200)
HDL: 44.4 mg/dL (ref >39.00)
LDL Cholesterol: 84 mg/dL (ref 0–99)
NONHDL: 96.47
Total CHOL/HDL Ratio: 3
Triglycerides: 61 mg/dL (ref 0.0–149.0)
VLDL: 12.2 mg/dL (ref 0.0–40.0)

## 2016-01-21 LAB — HEMOGLOBIN A1C: HEMOGLOBIN A1C: 6.3 % (ref 4.6–6.5)

## 2016-02-10 ENCOUNTER — Ambulatory Visit: Payer: Self-pay | Admitting: *Deleted

## 2016-02-11 ENCOUNTER — Encounter: Payer: Self-pay | Admitting: *Deleted

## 2016-02-14 HISTORY — PX: COLONOSCOPY: SHX174

## 2016-02-17 MED FILL — HYDROCORTISONE 2.5% CREAM: 2.5 | 10 days supply | Qty: 30 | Fill #1

## 2016-03-02 ENCOUNTER — Other Ambulatory Visit: Payer: Self-pay | Admitting: Internal Medicine

## 2016-03-03 ENCOUNTER — Other Ambulatory Visit: Payer: Self-pay | Admitting: Internal Medicine

## 2016-03-03 MED FILL — HYDROCHLOROTHIAZIDE 25 MG T: 25 | 90 days supply | Qty: 45 | Fill #0

## 2016-03-14 ENCOUNTER — Other Ambulatory Visit: Payer: Self-pay | Admitting: Internal Medicine

## 2016-03-14 MED FILL — JANUVIA 100 MG TABLET: 100 | 90 days supply | Qty: 90 | Fill #0

## 2016-03-14 MED FILL — ATORVASTATIN 20 MG TABLET: 20 | 90 days supply | Qty: 90 | Fill #0

## 2016-03-30 ENCOUNTER — Other Ambulatory Visit: Payer: Self-pay | Admitting: Internal Medicine

## 2016-03-30 MED FILL — AMLODIPINE BESYLATE 5 MG TA: 5 | 90 days supply | Qty: 90 | Fill #0

## 2016-04-03 MED FILL — HYDROCORTISONE 2.5% CREAM: 2.5 | 10 days supply | Qty: 30 | Fill #2

## 2016-04-24 ENCOUNTER — Other Ambulatory Visit: Payer: Self-pay | Admitting: Internal Medicine

## 2016-04-24 MED FILL — CARVEDILOL 6.25 MG TABLET: 6.25 | 90 days supply | Qty: 180 | Fill #0

## 2016-05-10 ENCOUNTER — Other Ambulatory Visit: Payer: Self-pay | Admitting: Internal Medicine

## 2016-05-10 MED FILL — HYDROCORTISONE 2.5% CREAM: 2.5 | 14 days supply | Qty: 30 | Fill #0

## 2016-05-11 ENCOUNTER — Other Ambulatory Visit: Payer: Self-pay | Admitting: *Deleted

## 2016-05-11 NOTE — Patient Outreach (Signed)
Attempted to reach patient on his work pager with no return call so left a message on his mobile number requesting he call this RNCM to schedule a follow up Link To Wellness appointment. Barrington Ellison RN,CCM,CDE [facility] Management Coordinator Link To Wellness Office Phone [phone] Office Fax [phone]

## 2016-05-15 ENCOUNTER — Ambulatory Visit: Payer: Self-pay | Admitting: *Deleted

## 2016-05-18 ENCOUNTER — Encounter: Payer: Self-pay | Admitting: *Deleted

## 2016-05-18 ENCOUNTER — Other Ambulatory Visit: Payer: Self-pay | Admitting: *Deleted

## 2016-05-18 NOTE — Patient Outreach (Signed)
[facility]) Care Management   05/18/16  Todd Newman 05/29/1960 [id number]  Todd Newman is an 56 y.o. male who presents to the [facility] office for routine Link To Wellness follow up for self management assistance with Type II DM, HTN, hyperlipidemia, and obesity.  Subjective: Todd Newman has no complaints. States he continues to check his blood sugars in the morning two times weekly and reports a fasting variance of 100-150. He denies hypoglycemia. 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  His medical record indicates he has an annual wellness exam on 07/14/16. He denies hospitalizations but did go to the ED in August for community acquired pneumonia.  He reports that he is up to date on his health maintenance checks.   Objective:   Review of Systems  Constitutional: Negative.     Physical Exam  Constitutional: He is oriented to person, place, and time. He appears well-developed and well-nourished.  Respiratory: Effort normal.  Neurological: He is alert and oriented to person, place, and time.  Skin: Skin is warm and dry.  Psychiatric: He has a normal mood and affect. His behavior is normal. Judgment and thought content normal.   Filed Weights   05/18/16 0843  Weight: 191 lb 3.2 oz (86.7 kg)   Blood pressure= 120/88  Encounter Medications:   Outpatient Encounter Prescriptions as of 05/18/2016  Medication Sig  . amLODipine (NORVASC) 5 MG tablet Take 1 tablet (5 mg total) by mouth daily.  Marland Kitchen aspirin 81 MG tablet Take 81 mg by mouth daily.  Marland Kitchen atorvastatin (LIPITOR) 20 MG tablet Take 1 tablet (20 mg total) by mouth daily.  . carvedilol (COREG) 6.25 MG tablet Take 1 tablet (6.25 mg total) by mouth 2 (two) times daily with a meal.  . hydrochlorothiazide (HYDRODIURIL) 25 MG tablet Take 0.5  tablets (12.5 mg total) by mouth daily.  . hydrocortisone 2.5 % cream Apply topically 2 (two) times daily.  . sildenafil (VIAGRA) 100 MG tablet Take 1 tablet (100 mg total) by mouth daily as needed for erectile dysfunction.  . sitaGLIPtin (JANUVIA) 100 MG tablet Take 1 tablet (100 mg total) by mouth daily.  . Blood Glucose Monitoring Suppl (Freestyle Freedom GLUCOSE) W/DEVICE KIT by Does not apply route. Reported on 07/19/2015  . Freestyle Freedom test strip USE TO CHECK BLOOD SUGAR ONCE DAILY. (Patient checks blood sugar 2-3 times weekly   Facility-Administered Encounter Medications as of 05/18/2016  Medication    Functional Status:   In your present state of health, do you have any difficulty performing the following activities: 05/18/2016 01/18/2016  Hearing? N N  Vision? N N  Difficulty concentrating or making decisions? N N  Walking or climbing stairs? N N  Dressing or bathing? N N  Doing errands, shopping? N N  Preparing Food and eating ? N -  Using the Toilet? N -  In the past six months, have you accidently leaked urine? N -  Do you have problems with loss of bowel control? N -  Managing your Medications? N -  Managing your Finances? N -  Housekeeping or managing your Housekeeping? N -  Some recent data might be hidden    Fall/Depression Screening:    PHQ 2/9 Scores 05/18/2016 01/18/2016 10/19/2015 07/19/2015 12/09/2014 09/08/2014  PHQ - 2 Score 0  0 0 0 0 0    Assessment:   [employer] employee with Type II DM with good management as evidenced by home CBG readings and Hgb A1C= 6.3% on 01/21/16 , with HTN and hyperlipidemia, with normal lipid profile on 01/21/16 also in good control. Now obese with body mass index= 30.9  Plan:  [facility] CM Care Plan Problem One   Flowsheet Row Most Recent Value  Care Plan Problem One Type II DM, HTN and hyperlipidemia currently meeting treatment targets as evidenced by A1C= 6.3% on 01/21/16 and normal lipid profile on 01/21/16 and BP <140/90 for all readings,  now obese but with weight loss of  1 lb since last Link To Wellness visit 8 months ago  Role Documenting the Problem One  Care Management [place] for Problem One  Active  [facility] Long Term Goal (31-90 days) Ongoing good management of Type II DM, hyperlipidemia and HTN as evidenced by meeting treatment targets at each assessment, and ongoing weight loss or no weight gain with short term goal of body mass index <30  [facility]N Long Term Goal Start Date 05/18/16  [facility] Long Term Goal Met Date   Interventions for Problem One Long Term Goal Reviewed health history since last office visit, reviewed patient medications, reviewed DM medications of Januvia including the mechanism of action, common side effects, dosages and dosing schedule, reinforced importance of taking all medications as prescribed, reviewed the role of statins, low dose aspirin and blood pressure medicines in the treatment of diabetes, discussed weight loss strategies with goal of reducing BMI to <30,  reviewed effects of physical activity on glucose levels and HDL levels and long-term glucose control by improving insulin sensitivity and assisting with weight management and cardiovascular health,  reviewed patient's CBG readings, reviewed fasting CBG target,  discussed results of 01/21/16 Hgb A1C, Hgb A1C goal, and correlation to estimated average glucose, discussed results of lipid profile and urine for microabuminuria,  Ensured that Todd Newman is up to date on his health maintenance checks and encouraged him to make dental appointment since he has not seen a dentist in many years and discussed risks associated with inadequate dental care,  arranged for Link To Wellness follow up on 09/28/16     RNCM to fax today's office visit note to Dr. Larose Kells. RNCM will meet twice yearly and as needed with patient to assist with Type II DM, HTN, hyperlipidemia and weight self-management and assess patient's progress toward mutually set goals.  Barrington Ellison RN,CCM,CDE [facility] Management Coordinator Link To Wellness Office Phone [phone] Office Fax [phone]

## 2016-05-29 MED FILL — HYDROCHLOROTHIAZIDE 25 MG T: 25 | 90 days supply | Qty: 45 | Fill #1

## 2016-05-31 ENCOUNTER — Encounter: Payer: Self-pay | Admitting: Family Medicine

## 2016-05-31 ENCOUNTER — Ambulatory Visit (INDEPENDENT_AMBULATORY_CARE_PROVIDER_SITE_OTHER): Payer: 59 | Admitting: Family Medicine

## 2016-05-31 VITALS — BP 126/80 | HR 74 | Temp 98.3°F | Resp 16 | Ht 66.0 in | Wt 192.0 lb

## 2016-05-31 DIAGNOSIS — M545 Low back pain, unspecified: Secondary | ICD-10-CM

## 2016-05-31 MED ORDER — KETOROLAC TROMETHAMINE 60 MG/2ML IM SOLN
60.0000 mg | Freq: Once | INTRAMUSCULAR | Status: AC
  Administered 2016-05-31: 60 mg via INTRAMUSCULAR

## 2016-05-31 MED ORDER — NAPROXEN 500 MG PO TBEC: 500.0000 mg | DELAYED_RELEASE_TABLET | Freq: Two times a day (BID) | ORAL | 0 refills | Status: DC

## 2016-05-31 MED ORDER — CYCLOBENZAPRINE HCL 5 MG PO TABS: 5.0000 mg | ORAL_TABLET | Freq: Three times a day (TID) | ORAL | 0 refills | Status: DC | PRN

## 2016-05-31 MED FILL — CYCLOBENZAPRINE 5 MG TABLET: 5 | 10 days supply | Qty: 30 | Fill #0
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
## 2016-05-31 NOTE — Addendum Note (Signed)
Addended by: Kem Boroughs D on: 05/31/2016 04:27 PM   Modules accepted: Orders

## 2016-05-31 NOTE — Patient Instructions (Addendum)
Do not take ibuprofen/Advil/naproxen for the rest of the day. OK to start using Naproxen tomorrow morning.  Heat (pad or rice pillow in microwave) over affected area, 10-15 minutes every 2-3 hours while awake.   Ice/cold pack over area for 10-15 min every 2-3 hours while awake.  Only do exercises and stretches that you can tolerate.  EXERCISES  RANGE OF MOTION (ROM) AND STRETCHING EXERCISES - Low Back Sprain Most people with lower back pain will find that their symptoms get worse with excessive bending forward (flexion) or arching at the lower back (extension). The exercises that will help resolve your symptoms will focus on the opposite motion.  Your physician, physical therapist or athletic trainer will help you determine which exercises will be most helpful to resolve your lower back pain. Do not complete any exercises without first consulting with your caregiver. Discontinue any exercises which make your symptoms worse, until you speak to your caregiver. If you have pain, numbness or tingling which travels down into your buttocks, leg or foot, the goal of the therapy is for these symptoms to move closer to your back and eventually resolve. Sometimes, these leg symptoms will get better, but your lower back pain may worsen. This is often an indication of progress in your rehabilitation. Be very alert to any changes in your symptoms and the activities in which you participated in the 24 hours prior to the change. Sharing this information with your caregiver will allow him or her to most efficiently treat your condition. These exercises may help you when beginning to rehabilitate your injury. Your symptoms may resolve with or without further involvement from your physician, physical therapist or athletic trainer. While completing these exercises, remember:   Restoring tissue flexibility helps normal motion to return to the joints. This allows healthier, less painful movement and activity.  An  effective stretch should be held for at least 30 seconds.  A stretch should never be painful. You should only feel a gentle lengthening or release in the stretched tissue. FLEXION RANGE OF MOTION AND STRETCHING EXERCISES:  STRETCH - Flexion, Single Knee to Chest   Lie on a firm bed or floor with both legs extended in front of you.  Keeping one leg in contact with the floor, bring your opposite knee to your chest. Hold your leg in place by either grabbing behind your thigh or at your knee.  Pull until you feel a gentle stretch in your low back. Hold 15-20 seconds.  Slowly release your grasp and repeat the exercise with the opposite side. Repeat 2 times. Complete this exercise 1-2 times per day.   STRETCH - Flexion, Double Knee to Chest  Lie on a firm bed or floor with both legs extended in front of you.  Keeping one leg in contact with the floor, bring your opposite knee to your chest.  Tense your stomach muscles to support your back and then lift your other knee to your chest. Hold your legs in place by either grabbing behind your thighs or at your knees.  Pull both knees toward your chest until you feel a gentle stretch in your low back. Hold 15-20 seconds.  Tense your stomach muscles and slowly return one leg at a time to the floor. Repeat 2 times. Complete this exercise 1-2 times per day.   STRETCH - Low Trunk Rotation  Lie on a firm bed or floor. Keeping your legs in front of you, bend your knees so they are both pointed toward the  ceiling and your feet are flat on the floor.  Extend your arms out to the side. This will stabilize your upper body by keeping your shoulders in contact with the floor.  Gently and slowly drop both knees together to one side until you feel a gentle stretch in your low back. Hold for 15-20 seconds.  Tense your stomach muscles to support your lower back as you bring your knees back to the starting position. Repeat the exercise to the other  side. Repeat 2 times. Complete this exercise 1-2 times per day  EXTENSION RANGE OF MOTION AND FLEXIBILITY EXERCISES:  STRETCH - Extension, Prone on Elbows   Lie on your stomach on the floor, a bed will be too soft. Place your palms about shoulder width apart and at the height of your head.  Place your elbows under your shoulders. If this is too painful, stack pillows under your chest.  Allow your body to relax so that your hips drop lower and make contact more completely with the floor.  Hold this position for 15-20 seconds.  Slowly return to lying flat on the floor. Repeat 2 times. Complete this exercise 1-2 times per day.   RANGE OF MOTION - Extension, Prone Press Ups  Lie on your stomach on the floor, a bed will be too soft. Place your palms about shoulder width apart and at the height of your head.  Keeping your back as relaxed as possible, slowly straighten your elbows while keeping your hips on the floor. You may adjust the placement of your hands to maximize your comfort. As you gain motion, your hands will come more underneath your shoulders.  Hold this position 15-20 seconds.  Slowly return to lying flat on the floor. Repeat 2 times. Complete this exercise 1-2 times per day.   RANGE OF MOTION- Quadruped, Neutral Spine   Assume a hands and knees position on a firm surface. Keep your hands under your shoulders and your knees under your hips. You may place padding under your knees for comfort.  Drop your head and point your tailbone toward the ground below you. This will round out your lower back like an angry cat. Hold this position for 15-20 seconds.  Slowly lift your head and release your tail bone so that your back sags into a large arch, like an old horse.  Hold this position for 15-20 seconds.  Repeat this until you feel limber in your low back.  Now, find your "sweet spot." This will be the most comfortable position somewhere between the two previous positions.  This is your neutral spine. Once you have found this position, tense your stomach muscles to support your low back.  Hold this position for 15-20 seconds. Repeat 2 times. Complete this exercise 1-2 times per day.  STRENGTHENING EXERCISES - Low Back Sprain These exercises may help you when beginning to rehabilitate your injury. These exercises should be done near your "sweet spot." This is the neutral, low-back arch, somewhere between fully rounded and fully arched, that is your least painful position. When performed in this safe range of motion, these exercises can be used for people who have either a flexion or extension based injury. These exercises may resolve your symptoms with or without further involvement from your physician, physical therapist or athletic trainer. While completing these exercises, remember:   Muscles can gain both the endurance and the strength needed for everyday activities through controlled exercises.  Complete these exercises as instructed by your physician, physical therapist  or Product/process development scientist. Increase the resistance and repetitions only as guided.  You may experience muscle soreness or fatigue, but the pain or discomfort you are trying to eliminate should never worsen during these exercises. If this pain does worsen, stop and make certain you are following the directions exactly. If the pain is still present after adjustments, discontinue the exercise until you can discuss the trouble with your caregiver.  STRENGTHENING - Deep Abdominals, Pelvic Tilt   Lie on a firm bed or floor. Keeping your legs in front of you, bend your knees so they are both pointed toward the ceiling and your feet are flat on the floor.  Tense your lower abdominal muscles to press your low back into the floor. This motion will rotate your pelvis so that your tail bone is scooping upwards rather than pointing at your feet or into the floor. With a gentle tension and even breathing, hold this  position for 10-15 seconds. Repeat 2 times. Complete this exercise 1 time per day.   STRENGTHENING - Abdominals, Crunches   Lie on a firm bed or floor. Keeping your legs in front of you, bend your knees so they are both pointed toward the ceiling and your feet are flat on the floor. Cross your arms over your chest.  Slightly tip your chin down without bending your neck.  Tense your abdominals and slowly lift your trunk high enough to just clear your shoulder blades. Lifting higher can put excessive stress on the lower back and does not further strengthen your abdominal muscles.  Control your return to the starting position. Repeat 2 times. Complete this exercise once every 1-2 days.   STRENGTHENING - Quadruped, Opposite UE/LE Lift   Assume a hands and knees position on a firm surface. Keep your hands under your shoulders and your knees under your hips. You may place padding under your knees for comfort.  Find your neutral spine and gently tense your abdominal muscles so that you can maintain this position. Your shoulders and hips should form a rectangle that is parallel with the floor and is not twisted.  Keeping your trunk steady, lift your right hand no higher than your shoulder and then your left leg no higher than your hip. Make sure you are not holding your breath. Hold this position for 15-20 seconds.  Continuing to keep your abdominal muscles tense and your back steady, slowly return to your starting position. Repeat with the opposite arm and leg. Repeat 2 times. Complete this exercise once every 1-2 days.   STRENGTHENING - Abdominals and Quadriceps, Straight Leg Raise   Lie on a firm bed or floor with both legs extended in front of you.  Keeping one leg in contact with the floor, bend the other knee so that your foot can rest flat on the floor.  Find your neutral spine, and tense your abdominal muscles to maintain your spinal position throughout the exercise.  Slowly lift  your straight leg off the floor about 6 inches for a count of 15, making sure to not hold your breath.  Still keeping your neutral spine, slowly lower your leg all the way to the floor. Repeat this exercise with each leg 2 times. Complete this exercise once every 1-2 days. POSTURE AND BODY MECHANICS CONSIDERATIONS - Low Back Sprain Keeping correct posture when sitting, standing or completing your activities will reduce the stress put on different body tissues, allowing injured tissues a chance to heal and limiting painful experiences. The following are  general guidelines for improved posture. Your physician or physical therapist will provide you with any instructions specific to your needs. While reading these guidelines, remember:  The exercises prescribed by your provider will help you have the flexibility and strength to maintain correct postures.  The correct posture provides the best environment for your joints to work. All of your joints have less wear and tear when properly supported by a spine with good posture. This means you will experience a healthier, less painful body.  Correct posture must be practiced with all of your activities, especially prolonged sitting and standing. Correct posture is as important when doing repetitive low-stress activities (typing) as it is when doing a single heavy-load activity (lifting).  RESTING POSITIONS Consider which positions are most painful for you when choosing a resting position. If you have pain with flexion-based activities (sitting, bending, stooping, squatting), choose a position that allows you to rest in a less flexed posture. You would want to avoid curling into a fetal position on your side. If your pain worsens with extension-based activities (prolonged standing, working overhead), avoid resting in an extended position such as sleeping on your stomach. Most people will find more comfort when they rest with their spine in a more neutral  position, neither too rounded nor too arched. Lying on a non-sagging bed on your side with a pillow between your knees, or on your back with a pillow under your knees will often provide some relief. Keep in mind, being in any one position for a prolonged period of time, no matter how correct your posture, can still lead to stiffness. PROPER SITTING POSTURE In order to minimize stress and discomfort on your spine, you must sit with correct posture. Sitting with good posture should be effortless for a healthy body. Returning to good posture is a gradual process. Many people can work toward this most comfortably by using various supports until they have the flexibility and strength to maintain this posture on their own. When sitting with proper posture, your ears will fall over your shoulders and your shoulders will fall over your hips. You should use the back of the chair to support your upper back. Your lower back will be in a neutral position, just slightly arched. You may place a small pillow or folded towel at the base of your lower back for  support.  When working at a desk, create an environment that supports good, upright posture. Without extra support, muscles tire, which leads to excessive strain on joints and other tissues. Keep these recommendations in mind:  CHAIR:  A chair should be able to slide under your desk when your back makes contact with the back of the chair. This allows you to work closely.  The chair's height should allow your eyes to be level with the upper part of your monitor and your hands to be slightly lower than your elbows.  BODY POSITION  Your feet should make contact with the floor. If this is not possible, use a foot rest.  Keep your ears over your shoulders. This will reduce stress on your neck and low back.  INCORRECT SITTING POSTURES  If you are feeling tired and unable to assume a healthy sitting posture, do not slouch or slump. This puts excessive strain on  your back tissues, causing more damage and pain. Healthier options include:  Using more support, like a lumbar pillow.  Switching tasks to something that requires you to be upright or walking.  Talking a brief walk.  Lying down to rest in a neutral-spine position.  PROLONGED STANDING WHILE SLIGHTLY LEANING FORWARD  When completing a task that requires you to lean forward while standing in one place for a long time, place either foot up on a stationary 2-4 inch high object to help maintain the best posture. When both feet are on the ground, the lower back tends to lose its slight inward curve. If this curve flattens (or becomes too large), then the back and your other joints will experience too much stress, tire more quickly, and can cause pain.  CORRECT STANDING POSTURES Proper standing posture should be assumed with all daily activities, even if they only take a few moments, like when brushing your teeth. As in sitting, your ears should fall over your shoulders and your shoulders should fall over your hips. You should keep a slight tension in your abdominal muscles to brace your spine. Your tailbone should point down to the ground, not behind your body, resulting in an over-extended swayback posture.   INCORRECT STANDING POSTURES  Common incorrect standing postures include a forward head, locked knees and/or an excessive swayback. WALKING Walk with an upright posture. Your ears, shoulders and hips should all line-up.  PROLONGED ACTIVITY IN A FLEXED POSITION When completing a task that requires you to bend forward at your waist or lean over a low surface, try to find a way to stabilize 3 out of 4 of your limbs. You can place a hand or elbow on your thigh or rest a knee on the surface you are reaching across. This will provide you more stability, so that your muscles do not tire as quickly. By keeping your knees relaxed, or slightly bent, you will also reduce stress across your lower  back. CORRECT LIFTING TECHNIQUES  DO :  Assume a wide stance. This will provide you more stability and the opportunity to get as close as possible to the object which you are lifting.  Tense your abdominals to brace your spine. Bend at the knees and hips. Keeping your back locked in a neutral-spine position, lift using your leg muscles. Lift with your legs, keeping your back straight.  Test the weight of unknown objects before attempting to lift them.  Try to keep your elbows locked down at your sides in order get the best strength from your shoulders when carrying an object.     Always ask for help when lifting heavy or awkward objects. INCORRECT LIFTING TECHNIQUES DO NOT:   Lock your knees when lifting, even if it is a small object.  Bend and twist. Pivot at your feet or move your feet when needing to change directions.  Assume that you can safely pick up even a paperclip without proper posture.

## 2016-05-31 NOTE — Progress Notes (Signed)
Pre visit review using our clinic review tool, if applicable. No additional management support is needed unless otherwise documented below in the visit note. 

## 2016-06-01 ENCOUNTER — Telehealth: Payer: Self-pay | Admitting: Internal Medicine

## 2016-06-01 NOTE — Telephone Encounter (Signed)
Caller name: [facility]  Call back [place]   Reason for call:   [employer] called regarding  [facility] Resor  naproxen (EC NAPROSYN) 500 MG EC tablet  would like to know if they can prescribe alternate do to the cost seeking a verbal order

## 2016-06-01 NOTE — Telephone Encounter (Signed)
OK to sub for cheaper alternative. TY.

## 2016-06-01 NOTE — Telephone Encounter (Signed)
Please advise.//AB/CMA 

## 2016-06-02 NOTE — Telephone Encounter (Signed)
Called and spoke with Christe @ [facility] and informed her of the message below.  She changed the prescription to plain Naproxen 500mg -Take 1 tablet by mouth twice daily with meal.//AB/CMA

## 2016-06-13 DIAGNOSIS — H524 Presbyopia: Secondary | ICD-10-CM | POA: Diagnosis not present

## 2016-06-13 DIAGNOSIS — E119 Type 2 diabetes mellitus without complications: Secondary | ICD-10-CM | POA: Diagnosis not present

## 2016-06-13 DIAGNOSIS — H52203 Unspecified astigmatism, bilateral: Secondary | ICD-10-CM | POA: Diagnosis not present

## 2016-06-13 DIAGNOSIS — H5203 Hypermetropia, bilateral: Secondary | ICD-10-CM | POA: Diagnosis not present

## 2016-06-20 ENCOUNTER — Encounter: Payer: Self-pay | Admitting: Internal Medicine

## 2016-06-20 ENCOUNTER — Ambulatory Visit (INDEPENDENT_AMBULATORY_CARE_PROVIDER_SITE_OTHER): Payer: 59 | Admitting: Internal Medicine

## 2016-06-20 VITALS — BP 134/80 | HR 83 | Temp 98.3°F | Resp 14 | Ht 66.0 in | Wt 190.1 lb

## 2016-06-20 DIAGNOSIS — M5441 Lumbago with sciatica, right side: Secondary | ICD-10-CM

## 2016-06-20 DIAGNOSIS — M5442 Lumbago with sciatica, left side: Secondary | ICD-10-CM

## 2016-06-20 MED ORDER — PREDNISONE 10 MG PO TABS: ORAL_TABLET | ORAL | 0 refills | Status: DC

## 2016-06-20 MED FILL — predniSONE 10 MG TABS: 10 | 8 days supply | Qty: 20 | Fill #0
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
## 2016-06-20 NOTE — Patient Instructions (Addendum)
GO TO THE LAB : Get the blood work      NO HAGA TRABAJOS PESADOS  USE UNA COMPRESA CALIENTE  TOME PREDNISONE   TOME TYLENOL 500 MG: 2 PASTILLAS CADA 8 HORAS  NO MAS IBUPROFENO  USE FLEXERIL, ES UN RELAJANTE MUSCULAR, LE DARA SUEN~O  LO VAMOS A MANDAR AL ORTHOPEDISTA   NO TOME EL LIPITOR POR 2 SEMANAS

## 2016-06-20 NOTE — Progress Notes (Signed)
Pre visit review using our clinic review tool, if applicable. No additional management support is needed unless otherwise documented below in the visit note. 

## 2016-06-21 LAB — BASIC METABOLIC PANEL
BUN: 19 mg/dL (ref 6–23)
CHLORIDE: 103 meq/L (ref 96–112)
CO2: 29 mEq/L (ref 19–32)
Calcium: 9.8 mg/dL (ref 8.4–10.5)
Creatinine, Ser: 0.94 mg/dL (ref 0.40–1.50)
GFR: 107.41 mL/min (ref >60.00)
Glucose, Bld: 112 mg/dL — ABNORMAL HIGH (ref 70–99)
POTASSIUM: 3.7 meq/L (ref 3.5–5.1)
SODIUM: 141 meq/L (ref 135–145)

## 2016-06-21 LAB — CBC WITH DIFFERENTIAL/PLATELET
BASOS PCT: 1 % (ref 0.0–3.0)
Basophils Absolute: 0.1 10*3/uL (ref 0.0–0.1)
EOS PCT: 3.3 % (ref 0.0–5.0)
Eosinophils Absolute: 0.2 10*3/uL (ref 0.0–0.7)
HEMATOCRIT: 37.7 % — AB (ref 39.0–52.0)
HEMOGLOBIN: 12.9 g/dL — AB (ref 13.0–17.0)
Lymphocytes Relative: 33.6 % (ref 12.0–46.0)
Lymphs Abs: 1.7 10*3/uL (ref 0.7–4.0)
MCHC: 34.2 g/dL (ref 30.0–36.0)
MCV: 88.7 fl (ref 78.0–100.0)
MONO ABS: 0.8 10*3/uL (ref 0.1–1.0)
Monocytes Relative: 15.3 % — ABNORMAL HIGH (ref 3.0–12.0)
Neutro Abs: 2.4 10*3/uL (ref 1.4–7.7)
Neutrophils Relative %: 46.8 % (ref 43.0–77.0)
Platelets: 354 10*3/uL (ref 150.0–400.0)
RBC: 4.24 Mil/uL (ref 4.22–5.81)
RDW: 13.5 % (ref 11.5–15.5)
WBC: 5.1 10*3/uL (ref 4.0–10.5)

## 2016-06-21 LAB — SEDIMENTATION RATE: Sed Rate: 20 mm/hr (ref 0–20)

## 2016-06-21 LAB — CK: CK TOTAL: 133 U/L (ref 7–232)

## 2016-06-21 NOTE — Assessment & Plan Note (Signed)
Lumbalgia: Started almost 4 weeks ago, interestingly radiates to both legs ; neurological exam is normal. Plan:  Will get some labs to rule out a systemic or a process different than MSK. CBC, BMP, sedimentation rate, CK. RX Prednisone, hold lipitor x 2 weeks  Stop ibuprofen and naproxen (has been taking that frequently). Tylenol and Flexeril okay. Refer to ortho. Warm compress. All discussed in Spanish  Follow-up as scheduled 07/19/2016

## 2016-06-28 MED FILL — JANUVIA 100 MG TABLET: 100 | 90 days supply | Qty: 90 | Fill #1

## 2016-07-05 MED FILL — AMLODIPINE BESYLATE 5 MG TA: 5 | 90 days supply | Qty: 90 | Fill #1

## 2016-07-12 ENCOUNTER — Ambulatory Visit (INDEPENDENT_AMBULATORY_CARE_PROVIDER_SITE_OTHER): Payer: 59

## 2016-07-12 ENCOUNTER — Ambulatory Visit (INDEPENDENT_AMBULATORY_CARE_PROVIDER_SITE_OTHER): Payer: 59 | Admitting: Orthopaedic Surgery

## 2016-07-12 DIAGNOSIS — M5442 Lumbago with sciatica, left side: Secondary | ICD-10-CM | POA: Diagnosis not present

## 2016-07-12 DIAGNOSIS — M5441 Lumbago with sciatica, right side: Secondary | ICD-10-CM

## 2016-07-12 MED ORDER — METHYLPREDNISOLONE 4 MG PO TABS: ORAL_TABLET | ORAL | 0 refills | Status: DC

## 2016-07-12 MED ORDER — TIZANIDINE HCL 4 MG PO TABS: 4.0000 mg | ORAL_TABLET | Freq: Three times a day (TID) | ORAL | 0 refills | Status: DC | PRN

## 2016-07-12 MED FILL — traMADol HCL 50 MG TABS: 50 | 10 days supply | Qty: 60 | Fill #0

## 2016-07-12 MED FILL — tiZANidine HCL 4 MG TABS: 4 | 20 days supply | Qty: 60 | Fill #0

## 2016-07-12 MED FILL — METHYLPREDNISOLONE 4 MG TAB: 4 | 6 days supply | Qty: 21 | Fill #0
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
## 2016-07-14 ENCOUNTER — Encounter: Payer: 59 | Admitting: Internal Medicine

## 2016-07-14 MED FILL — ATORVASTATIN 20 MG TABLET: 20 | 90 days supply | Qty: 90 | Fill #1

## 2016-07-19 ENCOUNTER — Encounter: Payer: 59 | Admitting: Internal Medicine

## 2016-07-25 ENCOUNTER — Ambulatory Visit (INDEPENDENT_AMBULATORY_CARE_PROVIDER_SITE_OTHER): Payer: 59 | Admitting: Physician Assistant

## 2016-07-25 DIAGNOSIS — M5440 Lumbago with sciatica, unspecified side: Secondary | ICD-10-CM

## 2016-07-25 NOTE — Progress Notes (Signed)
Todd Newman returns today follow-up of his low back pain. States that he is much improved feels that the Medrol Dosepak actually helped him quite a bit. He is no longer having any sciatic symptoms down either leg. Having no awaking pain.  Physical exam: Well-developed well-nourished male in no acute distress. Mood and Affect appropriate. 5 out of 5 strengths lower extremities throughout against resistance.. Negative straight leg raise bilaterally. He is able to touch his toes and has full extension of the lumbar spine without pain.  Impression: Resolved low back pain with radicular symptoms  Plan: At this point time recommend he follow up with [place] as needed. We did give him some exercises handouts for back stretching and strengthening.

## 2016-07-27 ENCOUNTER — Encounter: Payer: Self-pay | Admitting: Internal Medicine

## 2016-07-27 ENCOUNTER — Ambulatory Visit (INDEPENDENT_AMBULATORY_CARE_PROVIDER_SITE_OTHER): Payer: 59 | Admitting: Internal Medicine

## 2016-07-27 VITALS — BP 128/76 | HR 75 | Temp 97.5°F | Resp 14 | Ht 66.0 in | Wt 189.1 lb

## 2016-07-27 DIAGNOSIS — E119 Type 2 diabetes mellitus without complications: Secondary | ICD-10-CM

## 2016-07-27 DIAGNOSIS — Z0000 Encounter for general adult medical examination without abnormal findings: Secondary | ICD-10-CM
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
## 2016-07-27 NOTE — Assessment & Plan Note (Addendum)
--  Td 2010; Pneumonia shot 2015; prevnar 2017  --  + FHf heart disease, plan is to control cardiovascular risk factors. On  ASA  -- CCS:  Colonoscopy 09-2015, 5 years --Prostate cancer screening, DRE today normal, check a PSA  --Diet and exercise discussed -- labs : LFTs, CBC iron, ferritin, A1C , TSH

## 2016-07-27 NOTE — Assessment & Plan Note (Signed)
DM: cont same meds, check a a1c HTN: continue same meds, recent BMP wnl High cholesterol: on lipitor, not fasting, last LDL at goal, check LFTs. Lumbalgia, see las OV: labs neg, saw ortho, sx resolved after 2 rounds of steroids; still on a muscle relaxant, rec stretching and take meds prn only Mild anemia- see last labs, recheck labs  RTC 6 months

## 2016-07-27 NOTE — Progress Notes (Signed)
Pre visit review using our clinic review tool, if applicable. No additional management support is needed unless otherwise documented below in the visit note. 

## 2016-07-27 NOTE — Patient Instructions (Addendum)
GO TO THE LAB : Get the blood work     GO TO THE FRONT DESK Schedule your next appointment for a  Routine check up in 6 months fasting  REGRESE EN 6 MESES PARA SU CHEQUEO, REGRESE EN AYUNAS  TOME EL [place] MUSCULAR SOLO SI TIENE DOLOR

## 2016-07-28 LAB — CBC WITH DIFFERENTIAL/PLATELET
BASOS PCT: 1.5 % (ref 0.0–3.0)
Basophils Absolute: 0.1 10*3/uL (ref 0.0–0.1)
EOS PCT: 2 % (ref 0.0–5.0)
Eosinophils Absolute: 0.2 10*3/uL (ref 0.0–0.7)
HCT: 41.8 % (ref 39.0–52.0)
HEMOGLOBIN: 14.3 g/dL (ref 13.0–17.0)
LYMPHS ABS: 2.2 10*3/uL (ref 0.7–4.0)
Lymphocytes Relative: 27.2 % (ref 12.0–46.0)
MCHC: 34.1 g/dL (ref 30.0–36.0)
MCV: 90.5 fl (ref 78.0–100.0)
MONO ABS: 1.1 10*3/uL — AB (ref 0.1–1.0)
Monocytes Relative: 14.2 % — ABNORMAL HIGH (ref 3.0–12.0)
Neutro Abs: 4.4 10*3/uL (ref 1.4–7.7)
Neutrophils Relative %: 55.1 % (ref 43.0–77.0)
Platelets: 389 10*3/uL (ref 150.0–400.0)
RBC: 4.62 Mil/uL (ref 4.22–5.81)
RDW: 14.3 % (ref 11.5–15.5)
WBC: 8 10*3/uL (ref 4.0–10.5)

## 2016-07-28 LAB — IRON: IRON: 69 ug/dL (ref 42–165)

## 2016-07-28 LAB — HEMOGLOBIN A1C: HEMOGLOBIN A1C: 6.8 % — AB (ref 4.6–6.5)

## 2016-07-28 LAB — HEPATIC FUNCTION PANEL
ALT: 45 U/L (ref 0–53)
AST: 23 U/L (ref 0–37)
Albumin: 5 g/dL (ref 3.5–5.2)
Alkaline Phosphatase: 41 U/L (ref 39–117)
BILIRUBIN DIRECT: 0.1 mg/dL (ref 0.0–0.3)
BILIRUBIN TOTAL: 0.4 mg/dL (ref 0.2–1.2)
Total Protein: 8 g/dL (ref 6.0–8.3)

## 2016-07-28 LAB — TSH: TSH: 0.71 u[IU]/mL (ref 0.35–4.50)

## 2016-07-28 LAB — PSA: PSA: 0.79 ng/mL (ref 0.10–4.00)

## 2016-07-28 LAB — FERRITIN: FERRITIN: 260.8 ng/mL (ref 22.0–322.0)

## 2016-08-01 MED FILL — CARVEDILOL 6.25 MG TABLET: 6.25 | 90 days supply | Qty: 180 | Fill #1

## 2016-08-18 ENCOUNTER — Other Ambulatory Visit: Payer: Self-pay | Admitting: Internal Medicine

## 2016-08-18 MED FILL — SILDENAFIL 100 MG TABLET: 100 | 30 days supply | Qty: 6 | Fill #0

## 2016-08-31 ENCOUNTER — Other Ambulatory Visit: Payer: Self-pay | Admitting: Internal Medicine

## 2016-08-31 MED FILL — HYDROCHLOROTHIAZIDE 25 MG T: 25 | 90 days supply | Qty: 45 | Fill #0

## 2016-09-22 MED FILL — HYDROCORTISONE 2.5% CREAM: 2.5 | 14 days supply | Qty: 30 | Fill #1

## 2016-09-28 ENCOUNTER — Ambulatory Visit: Payer: Self-pay | Admitting: *Deleted

## 2016-10-03 ENCOUNTER — Ambulatory Visit: Payer: Self-pay | Admitting: *Deleted

## 2016-10-05 ENCOUNTER — Other Ambulatory Visit: Payer: Self-pay | Admitting: *Deleted

## 2016-10-05 NOTE — Patient Outreach (Signed)
[place] [facility]) Care Management   10/05/16  Todd Newman 1960/08/14 [id number]  Todd Newman is an 56 y.o. male who presents to the [facility] office for routine Link To Wellness follow up for self management assistance with Type II DM, HTN, hyperlipidemia, and obesity.  Subjective: Todd Newman has no complaints. Says he injured his back States he continues to check his blood sugars in the morning two times weekly and reports a fasting variance of 100-150. He denies hypoglycemia. He says he now has two full time jobs. 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  He reports he had an annual wellness exam on 07/27/16.  He reports that he is up to date on his health maintenance checks and will get his flu shot at [facility] when they are offered.   Objective:   Review of Systems  Constitutional: Negative.     Physical Exam  Constitutional: He is oriented to person, place, and time. He appears well-developed and well-nourished.  Respiratory: Effort normal.  Neurological: He is alert and oriented to person, place, and time.  Skin: Skin is warm and dry.  Psychiatric: He has a normal mood and affect. His behavior is normal. Judgment and thought content normal.   Today's Vitals   10/05/16 0849  BP: 122/70  Weight: 192 lb (87.1 kg)  Height: 1.676 m (5' 6" )     Encounter Medications:   Outpatient Encounter Prescriptions as of 05/18/2016  Medication Sig  . amLODipine (NORVASC) 5 MG tablet Take 1 tablet (5 mg total) by mouth daily.  Marland Kitchen aspirin 81 MG tablet Take 81 mg by mouth daily.  Marland Kitchen atorvastatin (LIPITOR) 20 MG tablet Take 1 tablet (20 mg total) by mouth daily.  . carvedilol (COREG) 6.25 MG tablet Take 1 tablet (6.25 mg total) by mouth 2 (two) times daily with a meal.  . hydrochlorothiazide (HYDRODIURIL) 25 MG tablet Take 0.5  tablets (12.5 mg total) by mouth daily.  . hydrocortisone 2.5 % cream Apply topically 2 (two) times daily.  . sildenafil (VIAGRA) 100 MG tablet Take 1 tablet (100 mg total) by mouth daily as needed for erectile dysfunction.  . sitaGLIPtin (JANUVIA) 100 MG tablet Take 1 tablet (100 mg total) by mouth daily.  . Blood Glucose Monitoring Suppl (Freestyle Freedom GLUCOSE) W/DEVICE KIT by Does not apply route. Reported on 07/19/2015  . Freestyle Freedom test strip USE TO CHECK BLOOD SUGAR ONCE DAILY. (Patient checks blood sugar 2-3 times weekly   Facility-Administered Encounter Medications as of 05/18/2016  Medication    Functional Status:   In your present state of health, do you have any difficulty performing the following activities: 05/18/2016 01/18/2016  Hearing? N N  Vision? N N  Difficulty concentrating or making decisions? N N  Walking or climbing stairs? N N  Dressing or bathing? N N  Doing errands, shopping? N N  Preparing Food and eating ? N -  Using the Toilet? N -  In the past six months, have you accidently leaked urine? N -  Do you have problems with loss of bowel control? N -  Managing your Medications? N -  Managing your Finances? N -  Housekeeping or managing your Housekeeping? N -  Some recent data might be hidden    Fall/Depression Screening:    PHQ 2/9 Scores 05/18/2016 01/18/2016 10/19/2015 07/19/2015  12/09/2014 09/08/2014  PHQ - 2 Score 0 0 0 0 0 0    Assessment:   Todd Newman employee with Type II DM with good management as evidenced by home CBG readings abut with increased Hgb A1C= 6.8% on 07/27/16, previously 6.3% , with HTN and hyperlipidemia, with normal lipid profile on 01/21/16 also in good control. More ongoing weight gain with body mass index= 31.0, previously 30.9  Plan:  [facility] CM Care Plan Problem One   Flowsheet Row Most Recent Value  Care Plan Problem One Type II DM, HTN ,hyperlipidemia and obesity with increased A1C= 6.8% previously  6.3%, normal lipid profile on  01/21/16 and BP <140/90 for all readings, obese with slight weight gain since last Link To Wellness visit   Role Documenting the Problem One  Care Management Coordinator  Care Plan for Problem One  Active  [facility] Long Term Goal (31-90 days) Improved management of Type II DM as evidenced by Hgb A1C <6.5% at next assessment, ongoing good control of hyperlipidemia and HTN as evidenced by meeting treatment targets at each assessment, and weight loss with short term goal of body mass index <30  [facility] Long Term Goal Start Date 10/05/16  [facility] Long Term Goal Met Date   Interventions for Problem One Long Term Goal Reviewed health history since last office visit, reviewed patient medications,reinforced importance of taking all medications as prescribed, discussed weight loss strategies with goal of reducing BMI to <30, provided CHO controlled Hispanic meal plans and gave Todd Newman the booklet entitled "La diabetes y usted",  reviewed effects of physical activity on glucose levels and HDL levels and long-term glucose control by improving insulin sensitivity and assisting with weight management and cardiovascular health,  reviewed patient's CBG readings, reviewed fasting CBG target, reviewed notes of his visit with Todd Newman on 6/14 and advised Todd Newman to call to schedule a follow up appointment in 6 months ( December 2018), discussed results of 07/27/16 Hgb A1C, Hgb A1C goal, and correlation to estimated average glucose and reviewed strategies to reduce Hgb A1C to <6.5%. Ensured that Todd Newman is up to date on his health maintenance checks and encouraged him again to make dental appointment since he has not seen a dentist in many years and discussed risks associated with inadequate dental care,  arranged for Link To Wellness follow up on 04/05/17     RNCM to fax today's office visit note to Todd Newman. RNCM will meet twice yearly and as needed with patient to assist with Type II DM, HTN, hyperlipidemia and weight self-management and assess  patient's progress toward mutually set goals.  Barrington Ellison RN,CCM,CDE [place] Management Coordinator Link To Wellness Office Phone [phone] Office Fax [phone]

## 2016-10-11 ENCOUNTER — Other Ambulatory Visit: Payer: Self-pay

## 2016-10-11 MED ORDER — SITAGLIPTIN PHOSPHATE 100 MG PO TABS: 100.0000 mg | ORAL_TABLET | Freq: Every day | ORAL | 1 refills | Status: DC

## 2016-10-11 MED FILL — JANUVIA 100 MG TABLET: 100 | 90 days supply | Qty: 90 | Fill #0

## 2016-10-18 ENCOUNTER — Other Ambulatory Visit: Payer: Self-pay | Admitting: Internal Medicine

## 2016-10-18 MED FILL — AMLODIPINE BESYLATE 5 MG TA: 5 | 90 days supply | Qty: 90 | Fill #0

## 2016-11-01 ENCOUNTER — Other Ambulatory Visit: Payer: Self-pay | Admitting: Internal Medicine

## 2016-11-01 MED FILL — ATORVASTATIN 20 MG TABLET: 20 | 90 days supply | Qty: 90 | Fill #0

## 2016-11-09 ENCOUNTER — Other Ambulatory Visit: Payer: Self-pay | Admitting: Internal Medicine

## 2016-11-09 MED FILL — CARVEDILOL 6.25 MG TABLET: 6.25 | 90 days supply | Qty: 180 | Fill #0

## 2016-11-27 MED FILL — HYDROCHLOROTHIAZIDE 25 MG T: 25 | 90 days supply | Qty: 45 | Fill #1

## 2017-01-09 ENCOUNTER — Other Ambulatory Visit: Payer: Self-pay | Admitting: *Deleted

## 2017-01-09 ENCOUNTER — Encounter: Payer: Self-pay | Admitting: *Deleted

## 2017-01-09 NOTE — Patient Outreach (Signed)
Spoke with Todd Newman via phone advising him that disease self-management services will be transitioned from the [facility] program to either [facility] or [facility] in 2019.  Also advised him that a letter will be mailed to the home residence with details of this transition.  [name] expressed his appreciation for the assistance this RNCM provided to him through the [employer].  Will close case to Link To Wellness diabetes program. Barrington Ellison RN,CCM,CDE [place] Management Coordinator Link To Wellness and [employer] [phone] Office Fax [phone]

## 2017-01-18 MED FILL — JANUVIA 100 MG TABLET: 100 | 90 days supply | Qty: 90 | Fill #1

## 2017-01-26 MED FILL — ATORVASTATIN 20 MG TABLET: 20 | 90 days supply | Qty: 90 | Fill #1

## 2017-01-26 MED FILL — AMLODIPINE BESYLATE 5 MG TA: 5 | 90 days supply | Qty: 90 | Fill #1

## 2017-02-12 MED FILL — CARVEDILOL 6.25 MG TABLET: 6.25 | 90 days supply | Qty: 180 | Fill #1

## 2017-02-26 ENCOUNTER — Other Ambulatory Visit: Payer: Self-pay | Admitting: Internal Medicine

## 2017-02-26 MED FILL — HYDROCHLOROTHIAZIDE 25 MG T: 25 | 90 days supply | Qty: 45 | Fill #0

## 2017-03-02 ENCOUNTER — Ambulatory Visit (HOSPITAL_BASED_OUTPATIENT_CLINIC_OR_DEPARTMENT_OTHER)
Admission: RE | Admit: 2017-03-02 | Discharge: 2017-03-02 | Disposition: A | Discharge status: Home / Self Care | Payer: 59 | Source: Ambulatory Visit | Attending: Internal Medicine | Admitting: Internal Medicine

## 2017-03-02 ENCOUNTER — Encounter: Payer: Self-pay | Admitting: Internal Medicine

## 2017-03-02 ENCOUNTER — Ambulatory Visit: Payer: 59 | Admitting: Internal Medicine

## 2017-03-02 VITALS — BP 126/78 | HR 68 | Temp 98.4°F | Resp 14 | Ht 66.0 in | Wt 195.0 lb

## 2017-03-02 DIAGNOSIS — S2224XA Fracture of xiphoid process, initial encounter for closed fracture: Secondary | ICD-10-CM | POA: Insufficient documentation

## 2017-03-02 DIAGNOSIS — I10 Essential (primary) hypertension: Secondary | ICD-10-CM | POA: Diagnosis not present

## 2017-03-02 DIAGNOSIS — X58XXXA Exposure to other specified factors, initial encounter: Secondary | ICD-10-CM | POA: Diagnosis not present

## 2017-03-02 DIAGNOSIS — R079 Chest pain, unspecified: Secondary | ICD-10-CM | POA: Diagnosis not present

## 2017-03-02 DIAGNOSIS — E119 Type 2 diabetes mellitus without complications: Secondary | ICD-10-CM | POA: Diagnosis not present

## 2017-03-02 NOTE — Assessment & Plan Note (Signed)
DM: Feet exam negative today, good compliance with Januvia, reports ambulatory CBGs are normal, will check a CMP and A1c. HTN, seems well controlled on amlodipine, carvedilol, HCTZ .  Checking labs. Chest pain: As described above, pain is located at the xiphoid process, likely MSK issue.  For completeness we will get an x-ray of the area.  Recommend observation.  Call if pain changes. Dyslipidemia on Lipitor, not fasting, labs on RTC Had a flu shot RTC 6 months.  CPX

## 2017-03-02 NOTE — Progress Notes (Signed)
Pre visit review using our clinic review tool, if applicable. No additional management support is needed unless otherwise documented below in the visit note. 
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
## 2017-03-02 NOTE — Patient Instructions (Signed)
GO TO THE LAB : Get the blood work     GO TO THE FRONT DESK Schedule your next appointment for a physical exam in 6 months  STOP BY THE FIRST FLOOR:  get the XR     [place] acuerdo a [employer]

## 2017-03-03 LAB — COMPREHENSIVE METABOLIC PANEL
AG RATIO: 1.8 (calc) (ref 1.0–2.5)
ALBUMIN MSPROF: 4.8 g/dL (ref 3.6–5.1)
ALT: 34 U/L (ref 9–46)
AST: 21 U/L (ref 10–35)
Alkaline phosphatase (APISO): 36 U/L — ABNORMAL LOW (ref 40–115)
BUN: 16 mg/dL (ref 7–25)
CHLORIDE: 101 mmol/L (ref 98–110)
CO2: 26 mmol/L (ref 20–32)
CREATININE: 0.78 mg/dL (ref 0.70–1.33)
Calcium: 9.8 mg/dL (ref 8.6–10.3)
GLOBULIN: 2.7 g/dL (ref 1.9–3.7)
GLUCOSE: 105 mg/dL — AB (ref 65–99)
POTASSIUM: 3.8 mmol/L (ref 3.5–5.3)
SODIUM: 140 mmol/L (ref 135–146)
Total Bilirubin: 0.4 mg/dL (ref 0.2–1.2)
Total Protein: 7.5 g/dL (ref 6.1–8.1)

## 2017-03-03 LAB — HEMOGLOBIN A1C
Hgb A1c MFr Bld: 6.5 % of total Hgb — ABNORMAL HIGH (ref <5.7)
MEAN PLASMA GLUCOSE: 140 (calc)
eAG (mmol/L): 7.7 (calc)

## 2017-04-05 ENCOUNTER — Ambulatory Visit: Payer: 59 | Admitting: *Deleted

## 2017-04-27 ENCOUNTER — Other Ambulatory Visit: Payer: Self-pay | Admitting: Internal Medicine

## 2017-04-27 MED FILL — HYDROCORTISONE 2.5% CREAM: 2.5 | 20 days supply | Qty: 30 | Fill #0

## 2017-04-27 MED FILL — JANUVIA 100 MG TABLET: 100 | 90 days supply | Qty: 90 | Fill #0

## 2017-05-02 MED FILL — AMLODIPINE BESYLATE 5 MG TA: 5 | 90 days supply | Qty: 90 | Fill #2

## 2017-05-16 ENCOUNTER — Other Ambulatory Visit: Payer: Self-pay | Admitting: Internal Medicine

## 2017-05-16 MED FILL — CARVEDILOL 6.25 MG TABS: 6.25 | 90 days supply | Qty: 180 | Fill #0

## 2017-05-16 MED FILL — ATORVASTATIN 20 MG TABLET: 20 | 90 days supply | Qty: 90 | Fill #0

## 2017-05-22 ENCOUNTER — Encounter: Payer: Self-pay | Admitting: Internal Medicine

## 2017-05-22 ENCOUNTER — Ambulatory Visit: Payer: 59 | Admitting: Internal Medicine

## 2017-05-22 VITALS — BP 132/70 | HR 74 | Temp 98.0°F | Resp 16 | Ht 66.0 in | Wt 195.2 lb

## 2017-05-22 DIAGNOSIS — B349 Viral infection, unspecified: Secondary | ICD-10-CM

## 2017-05-22 LAB — POC INFLUENZA A&B (BINAX/QUICKVUE)
Influenza A, POC: NEGATIVE
Influenza B, POC: NEGATIVE

## 2017-05-22 LAB — POCT RAPID STREP A (OFFICE): RAPID STREP A SCREEN: NEGATIVE

## 2017-05-22 NOTE — Progress Notes (Signed)
Pre visit review using our clinic review tool, if applicable. No additional management support is needed unless otherwise documented below in the visit note. 

## 2017-05-22 NOTE — Patient Instructions (Signed)
Get your blood work  Rest  Drink plenty of water  Take Tylenol or ibuprofen as needed.  Always take ibuprofen with food  Call if not gradually better
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
## 2017-05-23 LAB — CBC WITH DIFFERENTIAL/PLATELET
Basophils Absolute: 0.1 10*3/uL (ref 0.0–0.1)
Basophils Relative: 1.4 % (ref 0.0–3.0)
Eosinophils Absolute: 0.3 10*3/uL (ref 0.0–0.7)
Eosinophils Relative: 4.1 % (ref 0.0–5.0)
HCT: 39.7 % (ref 39.0–52.0)
Hemoglobin: 13.4 g/dL (ref 13.0–17.0)
LYMPHS ABS: 1.8 10*3/uL (ref 0.7–4.0)
Lymphocytes Relative: 26.9 % (ref 12.0–46.0)
MCHC: 33.8 g/dL (ref 30.0–36.0)
MCV: 90.2 fl (ref 78.0–100.0)
Monocytes Absolute: 0.8 10*3/uL (ref 0.1–1.0)
Monocytes Relative: 12.3 % — ABNORMAL HIGH (ref 3.0–12.0)
NEUTROS ABS: 3.6 10*3/uL (ref 1.4–7.7)
NEUTROS PCT: 55.3 % (ref 43.0–77.0)
PLATELETS: 336 10*3/uL (ref 150.0–400.0)
RBC: 4.4 Mil/uL (ref 4.22–5.81)
RDW: 14 % (ref 11.5–15.5)
WBC: 6.5 10*3/uL (ref 4.0–10.5)

## 2017-05-23 LAB — BASIC METABOLIC PANEL
BUN: 15 mg/dL (ref 6–23)
CO2: 32 mEq/L (ref 19–32)
Calcium: 9.7 mg/dL (ref 8.4–10.5)
Chloride: 100 mEq/L (ref 96–112)
Creatinine, Ser: 0.8 mg/dL (ref 0.40–1.50)
GFR: 128.93 mL/min (ref >60.00)
Glucose, Bld: 120 mg/dL — ABNORMAL HIGH (ref 70–99)
POTASSIUM: 3.4 meq/L — AB (ref 3.5–5.1)
SODIUM: 139 meq/L (ref 135–145)

## 2017-05-23 LAB — CK: CK TOTAL: 160 U/L (ref 7–232)

## 2017-05-23 NOTE — Assessment & Plan Note (Signed)
Viral syndrome?  (other patient seen in the community with similar symptoms) Presents with above symptoms, strep test and flu test negative. We will get CBC, BMP, total CK.  Otherwise conservative treatment, including rest, keep hydrated, ibuprofen or Tylenol as needed (GI precautions discussed). Also recommend to take 1 day off work. All of above discussed in Spanish, he verbalized understanding.

## 2017-05-28 ENCOUNTER — Other Ambulatory Visit: Payer: Self-pay | Admitting: Internal Medicine

## 2017-05-28 MED FILL — HYDROCHLOROTHIAZIDE 25 MG T: 25 | 90 days supply | Qty: 45 | Fill #0

## 2017-06-22 DIAGNOSIS — H5203 Hypermetropia, bilateral: Secondary | ICD-10-CM | POA: Diagnosis not present

## 2017-06-22 DIAGNOSIS — E119 Type 2 diabetes mellitus without complications: Secondary | ICD-10-CM | POA: Diagnosis not present

## 2017-06-22 DIAGNOSIS — H52203 Unspecified astigmatism, bilateral: Secondary | ICD-10-CM | POA: Diagnosis not present

## 2017-06-22 DIAGNOSIS — H524 Presbyopia: Secondary | ICD-10-CM | POA: Diagnosis not present

## 2017-06-22 DIAGNOSIS — H2513 Age-related nuclear cataract, bilateral: Secondary | ICD-10-CM | POA: Diagnosis not present

## 2017-06-22 LAB — HM DIABETES EYE EXAM

## 2017-07-31 ENCOUNTER — Other Ambulatory Visit: Payer: Self-pay | Admitting: Internal Medicine

## 2017-07-31 MED FILL — AMLODIPINE BESYLATE 5 MG TA: 5 | 90 days supply | Qty: 90 | Fill #0

## 2017-07-31 MED FILL — JANUVIA 100 MG TABLET: 100 | 90 days supply | Qty: 90 | Fill #1

## 2017-08-15 MED FILL — CARVEDILOL 6.25 MG TABS: 6.25 | 90 days supply | Qty: 180 | Fill #1

## 2017-08-15 MED FILL — ATORVASTATIN CALCIUM 20 MG: 20 | 90 days supply | Qty: 90 | Fill #1

## 2017-08-30 ENCOUNTER — Encounter: Payer: Self-pay | Admitting: Internal Medicine

## 2017-08-30 ENCOUNTER — Other Ambulatory Visit: Payer: Self-pay | Admitting: Internal Medicine

## 2017-08-30 ENCOUNTER — Ambulatory Visit (INDEPENDENT_AMBULATORY_CARE_PROVIDER_SITE_OTHER): Payer: 59 | Admitting: Internal Medicine

## 2017-08-30 VITALS — BP 124/76 | HR 85 | Temp 97.5°F | Resp 16 | Ht 66.0 in | Wt 201.1 lb

## 2017-08-30 DIAGNOSIS — Z0000 Encounter for general adult medical examination without abnormal findings: Secondary | ICD-10-CM

## 2017-08-30 DIAGNOSIS — E119 Type 2 diabetes mellitus without complications: Secondary | ICD-10-CM | POA: Diagnosis not present

## 2017-08-30 MED FILL — HYDROCHLOROTHIAZIDE 25 MG T: 25 | 90 days supply | Qty: 45 | Fill #1
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
## 2017-08-30 NOTE — Patient Instructions (Signed)
GO TO THE LAB : Get the blood work     GO TO THE FRONT DESK Schedule your next appointment for a   checkup in 4 months  
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
## 2017-08-30 NOTE — Progress Notes (Signed)
Pre visit review using our clinic review tool, if applicable. No additional management support is needed unless otherwise documented below in the visit note. 

## 2017-08-31 ENCOUNTER — Encounter: Payer: 59 | Admitting: Internal Medicine

## 2017-08-31 LAB — COMPREHENSIVE METABOLIC PANEL
ALK PHOS: 40 U/L (ref 39–117)
ALT: 42 U/L (ref 0–53)
AST: 17 U/L (ref 0–37)
Albumin: 4.6 g/dL (ref 3.5–5.2)
BUN: 16 mg/dL (ref 6–23)
CHLORIDE: 100 meq/L (ref 96–112)
CO2: 32 mEq/L (ref 19–32)
Calcium: 9.9 mg/dL (ref 8.4–10.5)
Creatinine, Ser: 0.97 mg/dL (ref 0.40–1.50)
GFR: 103.13 mL/min (ref >60.00)
Glucose, Bld: 161 mg/dL — ABNORMAL HIGH (ref 70–99)
POTASSIUM: 4.3 meq/L (ref 3.5–5.1)
SODIUM: 139 meq/L (ref 135–145)
TOTAL PROTEIN: 7.6 g/dL (ref 6.0–8.3)
Total Bilirubin: 0.3 mg/dL (ref 0.2–1.2)

## 2017-08-31 LAB — LIPID PANEL
Cholesterol: 165 mg/dL (ref 0–200)
HDL: 43.8 mg/dL (ref >39.00)
NonHDL: 120.76
Total CHOL/HDL Ratio: 4
Triglycerides: 289 mg/dL — ABNORMAL HIGH (ref 0.0–149.0)
VLDL: 57.8 mg/dL — AB (ref 0.0–40.0)

## 2017-08-31 LAB — MICROALBUMIN / CREATININE URINE RATIO
Creatinine,U: 149.8 mg/dL
Microalb Creat Ratio: 0.5 mg/g (ref 0.0–30.0)
Microalb, Ur: 0.8 mg/dL (ref 0.0–1.9)

## 2017-08-31 LAB — LDL CHOLESTEROL, DIRECT: Direct LDL: 83 mg/dL

## 2017-08-31 LAB — HEMOGLOBIN A1C: Hgb A1c MFr Bld: 7 % — ABNORMAL HIGH (ref 4.6–6.5)

## 2017-08-31 NOTE — Assessment & Plan Note (Signed)
DM: On Januvia, has gained weight, we had a long discussion about diet, he is very active at work.  Self-education limited, does not have a computer or  understand English well.  Recommend a nutritionist referral, will call when ready HTN: Seems controlled, on amlodipine, carvedilol, HCTZ. Hyperlipidemia: On Lipitor, checking labs. RTC 4 months

## 2017-09-05 MED FILL — HYDROCORTISONE 2.5% CREAM: 2.5 | 20 days supply | Qty: 30 | Fill #1

## 2017-11-02 ENCOUNTER — Other Ambulatory Visit: Payer: Self-pay | Admitting: Internal Medicine

## 2017-11-02 MED FILL — JANUVIA 100 MG TABLET: 100 | 60 days supply | Qty: 60 | Fill #0

## 2017-11-08 MED FILL — AMLODIPINE BESYLATE 5 MG TA: 5 | 90 days supply | Qty: 90 | Fill #1

## 2017-11-16 ENCOUNTER — Other Ambulatory Visit: Payer: Self-pay | Admitting: Internal Medicine

## 2017-11-16 MED FILL — CARVEDILOL 6.25 MG TABS: 6.25 | 90 days supply | Qty: 180 | Fill #0

## 2017-11-26 ENCOUNTER — Other Ambulatory Visit: Payer: Self-pay | Admitting: Internal Medicine

## 2017-11-26 MED FILL — ATORVASTATIN CALCIUM 20 MG: 20 | 90 days supply | Qty: 90 | Fill #0

## 2017-11-26 MED FILL — HYDROCHLOROTHIAZIDE 25 MG T: 25 | 90 days supply | Qty: 45 | Fill #0

## 2018-01-07 MED FILL — JANUVIA 100 MG TABLET: 100 | 30 days supply | Qty: 30 | Fill #1

## 2018-01-22 IMAGING — CR DG CHEST 2V
2 series · 2 of 2 positions shown · non-contrast
Comparison: 07/04/2004

CLINICAL DATA: Cough and fever for 3 days

EXAM:
CHEST  2 VIEW

[w chest pa]
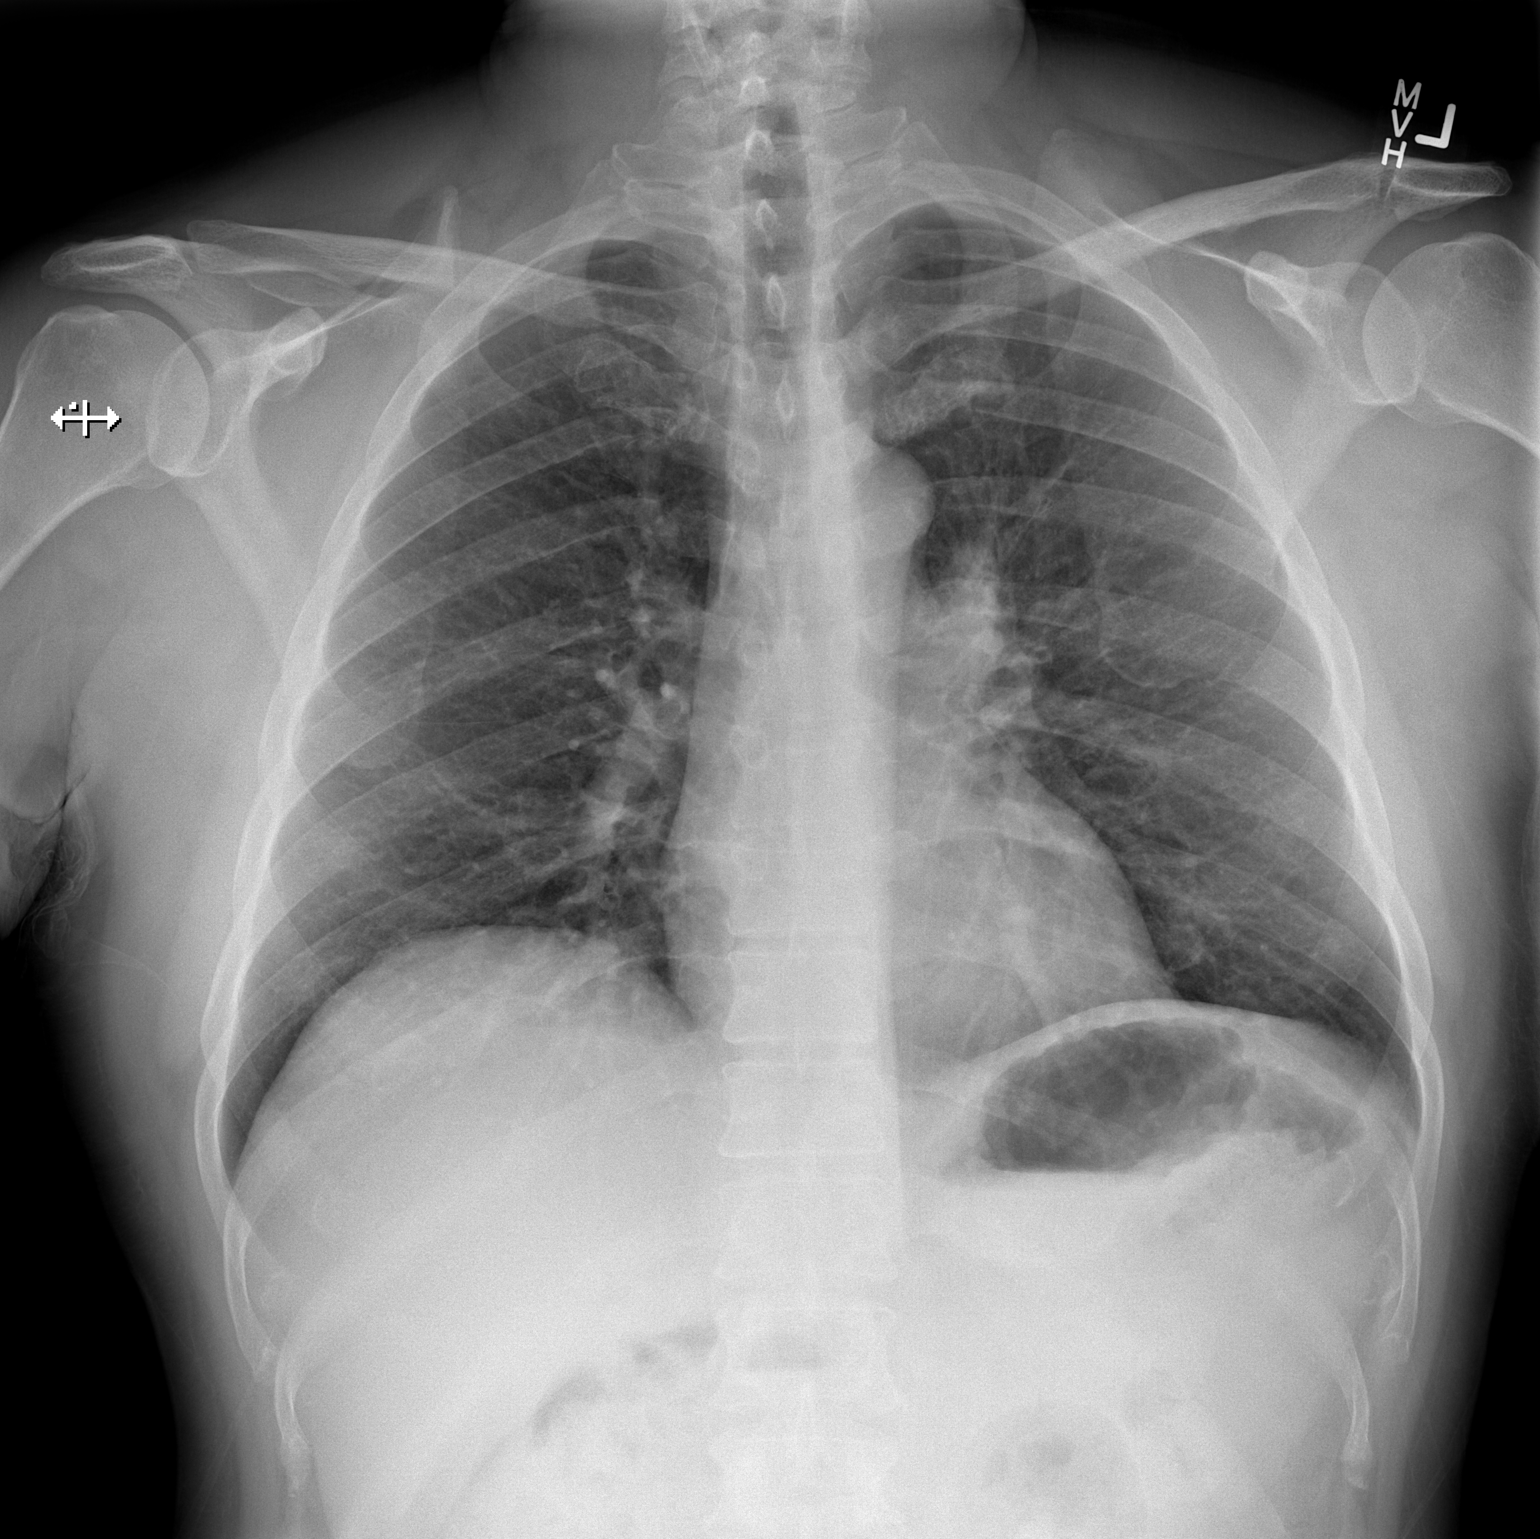

[w chest lat]
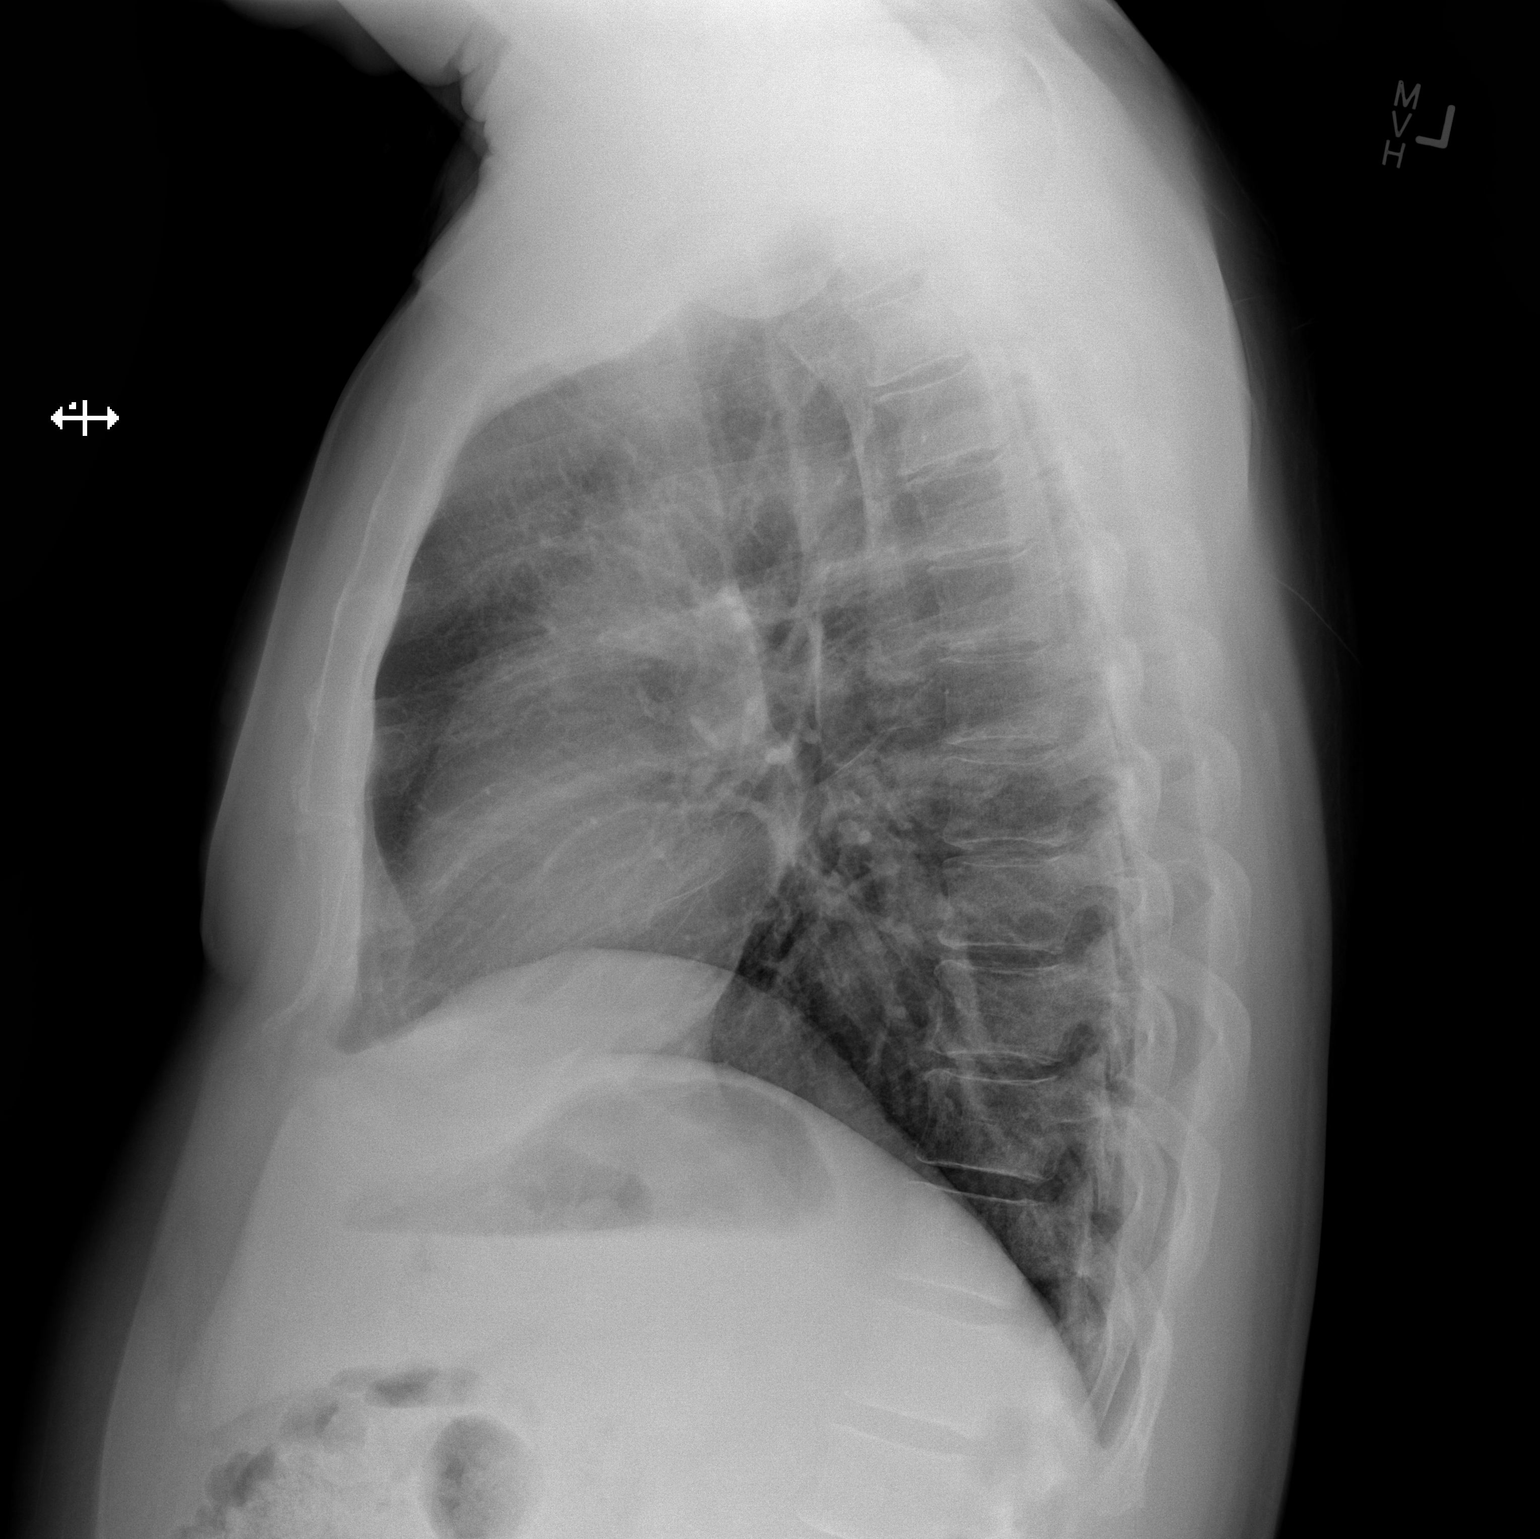

[2 of 2 positions shown; findings below may reference images not displayed]

FINDINGS: There is a new left suprahilar opacity which may represent central
airspace disease. No pneumothorax. No pleural effusion. Thoracic
spine is intact.
IMPRESSION: New all suprahilar opacity on the left. Findings may represent
central airspace disease. Underlying mass is not excluded. Followup
PA and lateral chest X-ray is recommended in 3-4 weeks following
trial of antibiotic therapy to ensure resolution and exclude
underlying malignancy.

## 2018-02-07 ENCOUNTER — Other Ambulatory Visit: Payer: Self-pay | Admitting: Internal Medicine

## 2018-02-07 MED FILL — AMLODIPINE BESYLATE 5 MG TA: 5 | 90 days supply | Qty: 90 | Fill #0

## 2018-02-07 MED FILL — JANUVIA 100 MG TABLET: 100 | 90 days supply | Qty: 90 | Fill #0

## 2018-02-19 MED FILL — CARVEDILOL 6.25 MG TABLET: 6.25 | 90 days supply | Qty: 180 | Fill #1

## 2018-02-19 MED FILL — HYDROCHLOROTHIAZIDE 25 MG T: 25 | 90 days supply | Qty: 45 | Fill #1

## 2018-03-06 MED FILL — ATORVASTATIN CALCIUM 20 MG: 20 | 90 days supply | Qty: 90 | Fill #1

## 2018-04-09 ENCOUNTER — Telehealth: Payer: Self-pay | Admitting: Internal Medicine

## 2018-04-09 MED FILL — HYDROCORTISONE 2.5% CREAM: 2.5 | 7 days supply | Qty: 30 | Fill #0

## 2018-04-09 NOTE — Telephone Encounter (Signed)
LVM in Spanish for pt to call office since provider requested pt is due for office visit.

## 2018-04-09 NOTE — Telephone Encounter (Signed)
Kennyth Lose- please call Pt- he is overdue for office visit w/ Dr. Larose Kells. Please schedule at his earliest convenience. Thank you.

## 2018-04-11 ENCOUNTER — Encounter: Payer: Self-pay | Admitting: Internal Medicine

## 2018-04-12 ENCOUNTER — Ambulatory Visit: Payer: 59 | Admitting: Internal Medicine

## 2018-04-12 ENCOUNTER — Encounter: Payer: Self-pay | Admitting: Internal Medicine

## 2018-04-12 VITALS — BP 122/68 | HR 68 | Temp 97.8°F | Resp 16 | Ht 66.0 in | Wt 196.4 lb

## 2018-04-12 DIAGNOSIS — Z09 Encounter for follow-up examination after completed treatment for conditions other than malignant neoplasm: Secondary | ICD-10-CM | POA: Diagnosis not present

## 2018-04-12 DIAGNOSIS — I10 Essential (primary) hypertension: Secondary | ICD-10-CM

## 2018-04-12 DIAGNOSIS — E119 Type 2 diabetes mellitus without complications: Secondary | ICD-10-CM | POA: Diagnosis not present

## 2018-04-12 LAB — BASIC METABOLIC PANEL
BUN: 17 mg/dL (ref 6–23)
CALCIUM: 9.3 mg/dL (ref 8.4–10.5)
CO2: 31 mEq/L (ref 19–32)
Chloride: 101 mEq/L (ref 96–112)
Creatinine, Ser: 0.74 mg/dL (ref 0.40–1.50)
GFR: 132.3 mL/min (ref >60.00)
GLUCOSE: 124 mg/dL — AB (ref 70–99)
POTASSIUM: 4.1 meq/L (ref 3.5–5.1)
Sodium: 138 mEq/L (ref 135–145)

## 2018-04-12 LAB — MICROALBUMIN / CREATININE URINE RATIO
CREATININE, U: 71.1 mg/dL
MICROALB/CREAT RATIO: 1 mg/g (ref 0.0–30.0)
Microalb, Ur: <0.7 mg/dL (ref 0.0–1.9)

## 2018-04-12 LAB — HEMOGLOBIN A1C: Hgb A1c MFr Bld: 7.2 % — ABNORMAL HIGH (ref 4.6–6.5)

## 2018-04-12 NOTE — Patient Instructions (Signed)
GO TO THE LAB : Get the blood work     GO TO THE FRONT DESK Schedule your next appointment  For a physical by 08-2018, fasting

## 2018-04-12 NOTE — Progress Notes (Signed)
Pre visit review using our clinic review tool, if applicable. No additional management support is needed unless otherwise documented below in the visit note. 
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
## 2018-04-13 NOTE — Assessment & Plan Note (Addendum)
DM: Last A1c was 7.0, on  Januvia, medication was not changed, he was recommended to do better with lifestyle.  Check A1c and micro.  Foot exam negative, advised about diet, he is very active at work. HTN: On amlodipine, carvedilol, hydrochlorothiazide.  Checking a BMP.  Reports normal ambulatory BPs. Eczema?  Has occasional superficial itchy rash at different areas, uses hydrocodone cream sporadically. RTC ~ 08/2018, CPX

## 2018-04-17 ENCOUNTER — Other Ambulatory Visit: Payer: Self-pay

## 2018-04-17 DIAGNOSIS — E119 Type 2 diabetes mellitus without complications: Secondary | ICD-10-CM

## 2018-04-17 MED ORDER — METFORMIN HCL 850 MG PO TABS: 850.0000 mg | ORAL_TABLET | Freq: Two times a day (BID) | ORAL | 3 refills | Status: DC

## 2018-04-17 MED FILL — metFORMIN HCL 850 MG TABS: 850 | 30 days supply | Qty: 60 | Fill #0

## 2018-05-14 ENCOUNTER — Other Ambulatory Visit: Payer: Self-pay | Admitting: Internal Medicine

## 2018-05-14 MED FILL — AMLODIPINE BESYLATE 5 MG TA: 5 | 90 days supply | Qty: 90 | Fill #1

## 2018-05-23 ENCOUNTER — Other Ambulatory Visit: Payer: Self-pay | Admitting: Internal Medicine

## 2018-05-23 MED FILL — HYDROCHLOROTHIAZIDE 25 MG T: 25 | 90 days supply | Qty: 45 | Fill #0

## 2018-05-23 MED FILL — CARVEDILOL 6.25 MG TABLET: 6.25 | 90 days supply | Qty: 180 | Fill #0

## 2018-06-03 MED FILL — HYDROCORTISONE 2.5% CREAM: 2.5 | 7 days supply | Qty: 30 | Fill #1

## 2018-06-11 ENCOUNTER — Other Ambulatory Visit: Payer: Self-pay | Admitting: Internal Medicine

## 2018-06-11 MED FILL — ATORVASTATIN 20 MG TABLET: 20 | 30 days supply | Qty: 30 | Fill #0

## 2018-06-11 MED FILL — metFORMIN HCL 850 MG TABS: 850 | 30 days supply | Qty: 60 | Fill #1

## 2018-06-28 ENCOUNTER — Telehealth: Payer: Self-pay | Admitting: Internal Medicine

## 2018-06-28 NOTE — Telephone Encounter (Signed)
-----   Message from Colon Branch, MD sent at 06/27/2018  1:55 PM EDT ----- Regarding: Todd Newman, he is due for blood work, please schedule an appointment

## 2018-06-28 NOTE — Telephone Encounter (Signed)
Pt scheduled 07-10-2018 since pt wanted an early appt.

## 2018-07-10 ENCOUNTER — Other Ambulatory Visit: Payer: 59

## 2018-07-13 ENCOUNTER — Other Ambulatory Visit: Payer: Self-pay | Admitting: Internal Medicine

## 2018-07-15 MED FILL — ATORVASTATIN 20 MG TABLET: 20 | 30 days supply | Qty: 30 | Fill #0

## 2018-07-17 ENCOUNTER — Other Ambulatory Visit: Payer: Self-pay

## 2018-07-17 ENCOUNTER — Other Ambulatory Visit (INDEPENDENT_AMBULATORY_CARE_PROVIDER_SITE_OTHER): Payer: 59

## 2018-07-17 DIAGNOSIS — E119 Type 2 diabetes mellitus without complications: Secondary | ICD-10-CM | POA: Diagnosis not present

## 2018-07-18 ENCOUNTER — Other Ambulatory Visit: Payer: Self-pay | Admitting: Internal Medicine

## 2018-07-18 LAB — HEPATIC FUNCTION PANEL
ALT: 29 U/L (ref 0–53)
AST: 16 U/L (ref 0–37)
Albumin: 4.6 g/dL (ref 3.5–5.2)
Alkaline Phosphatase: 33 U/L — ABNORMAL LOW (ref 39–117)
Bilirubin, Direct: 0.1 mg/dL (ref 0.0–0.3)
Total Bilirubin: 0.3 mg/dL (ref 0.2–1.2)
Total Protein: 7.6 g/dL (ref 6.0–8.3)

## 2018-07-18 MED ORDER — METFORMIN HCL 850 MG PO TABS: 850.0000 mg | ORAL_TABLET | Freq: Two times a day (BID) | ORAL | 6 refills | Status: DC

## 2018-07-18 MED FILL — metFORMIN HCL 850 MG TABS: 850 | 30 days supply | Qty: 60 | Fill #0

## 2018-07-31 MED FILL — metFORMIN HCL 850 MG TABS: 850 | 30 days supply | Qty: 60 | Fill #2

## 2018-08-15 ENCOUNTER — Other Ambulatory Visit: Payer: Self-pay | Admitting: Internal Medicine

## 2018-08-15 MED FILL — ATORVASTATIN 20 MG TABLET: 20 | 90 days supply | Qty: 90 | Fill #0

## 2018-08-15 MED FILL — HYDROCHLOROTHIAZIDE 25 MG T: 25 | 90 days supply | Qty: 45 | Fill #1

## 2018-08-22 MED FILL — JANUVIA 100 MG TABLET: 100 | 90 days supply | Qty: 90 | Fill #1

## 2018-08-23 ENCOUNTER — Telehealth: Payer: Self-pay | Admitting: Internal Medicine

## 2018-08-23 MED FILL — HYDROCORTISONE 2.5% CREAM: 2.5 | 10 days supply | Qty: 30 | Fill #0

## 2018-08-23 NOTE — Telephone Encounter (Signed)
Sent!

## 2018-08-23 NOTE — Telephone Encounter (Signed)
Hydrocortisone 2.5% cream refill. Okay to refill?

## 2018-08-27 MED FILL — CARVEDILOL 6.25 MG TABLET: 6.25 | 90 days supply | Qty: 180 | Fill #1

## 2018-09-30 MED FILL — metFORMIN HCL 850 MG TABS: 850 | 30 days supply | Qty: 60 | Fill #3

## 2018-10-23 ENCOUNTER — Other Ambulatory Visit: Payer: Self-pay

## 2018-10-23 ENCOUNTER — Ambulatory Visit (INDEPENDENT_AMBULATORY_CARE_PROVIDER_SITE_OTHER): Payer: 59 | Admitting: Internal Medicine

## 2018-10-23 ENCOUNTER — Encounter: Payer: Self-pay | Admitting: Internal Medicine

## 2018-10-23 VITALS — BP 138/93 | HR 79 | Temp 96.9°F | Resp 16 | Ht 66.0 in | Wt 197.0 lb

## 2018-10-23 DIAGNOSIS — Z125 Encounter for screening for malignant neoplasm of prostate: Secondary | ICD-10-CM | POA: Diagnosis not present

## 2018-10-23 DIAGNOSIS — E119 Type 2 diabetes mellitus without complications: Secondary | ICD-10-CM | POA: Diagnosis not present

## 2018-10-23 DIAGNOSIS — Z23 Encounter for immunization: Secondary | ICD-10-CM | POA: Diagnosis not present

## 2018-10-23 DIAGNOSIS — Z0000 Encounter for general adult medical examination without abnormal findings: Secondary | ICD-10-CM

## 2018-10-23 LAB — HEMOGLOBIN A1C: Hgb A1c MFr Bld: 7.1 % — ABNORMAL HIGH (ref 4.6–6.5)

## 2018-10-23 LAB — CBC WITH DIFFERENTIAL/PLATELET
Basophils Absolute: 0 10*3/uL (ref 0.0–0.1)
Basophils Relative: 0.7 % (ref 0.0–3.0)
Eosinophils Absolute: 0.2 10*3/uL (ref 0.0–0.7)
Eosinophils Relative: 3 % (ref 0.0–5.0)
HCT: 40.7 % (ref 39.0–52.0)
Hemoglobin: 14 g/dL (ref 13.0–17.0)
Lymphocytes Relative: 25.7 % (ref 12.0–46.0)
Lymphs Abs: 1.6 10*3/uL (ref 0.7–4.0)
MCHC: 34.3 g/dL (ref 30.0–36.0)
MCV: 90.2 fl (ref 78.0–100.0)
Monocytes Absolute: 0.6 10*3/uL (ref 0.1–1.0)
Monocytes Relative: 10.3 % (ref 3.0–12.0)
Neutro Abs: 3.8 10*3/uL (ref 1.4–7.7)
Neutrophils Relative %: 60.3 % (ref 43.0–77.0)
Platelets: 351 10*3/uL (ref 150.0–400.0)
RBC: 4.52 Mil/uL (ref 4.22–5.81)
RDW: 13.9 % (ref 11.5–15.5)
WBC: 6.2 10*3/uL (ref 4.0–10.5)

## 2018-10-23 LAB — PSA: PSA: 0.75 ng/mL (ref 0.10–4.00)

## 2018-10-23 LAB — LIPID PANEL
Cholesterol: 144 mg/dL (ref 0–200)
HDL: 36.6 mg/dL — ABNORMAL LOW (ref >39.00)
NonHDL: 107.71
Total CHOL/HDL Ratio: 4
Triglycerides: 236 mg/dL — ABNORMAL HIGH (ref 0.0–149.0)
VLDL: 47.2 mg/dL — ABNORMAL HIGH (ref 0.0–40.0)

## 2018-10-23 LAB — BASIC METABOLIC PANEL
BUN: 16 mg/dL (ref 6–23)
CO2: 34 mEq/L — ABNORMAL HIGH (ref 19–32)
Calcium: 10.3 mg/dL (ref 8.4–10.5)
Chloride: 99 mEq/L (ref 96–112)
Creatinine, Ser: 1.04 mg/dL (ref 0.40–1.50)
GFR: 89.16 mL/min (ref >60.00)
Glucose, Bld: 136 mg/dL — ABNORMAL HIGH (ref 70–99)
Potassium: 4.3 mEq/L (ref 3.5–5.1)
Sodium: 140 mEq/L (ref 135–145)

## 2018-10-23 LAB — LDL CHOLESTEROL, DIRECT: Direct LDL: 86 mg/dL
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
## 2018-10-23 NOTE — Patient Instructions (Addendum)
Per our records you are due for an eye exam. Please contact your eye doctor to schedule an appointment. Please have them send copies of your office visit notes to [place]. Our fax number is [phone].   GO TO THE LAB : Get the blood work     GO TO THE FRONT DESK Schedule your next appointment for a checkup in 4 months  ===== Diabetes: Check your blood sugar  3-4 times a week  Check your blood sugar  at different times of the day  GOALS: Fasting before a meal 70- 130 2 hours after a meal less than 180 -   ====== Check the  blood pressure  Weekly  BP GOAL is between 110/65 and  135/85. If it is consistently higher or lower, let me know

## 2018-10-23 NOTE — Assessment & Plan Note (Signed)
--  Tdat 10/2018 - Pneumonia shot 2015 - prevnar 2017  - shingrix : will discuss next year - flu shot at work --  + FHf heart disease, plan is to control cardiovascular risk factors. On  ASA  -- CCS:  Colonoscopy 09-2015, 5 years --Prostate cancer screening, DRE wnl, check a PSA --Diet and exercise : very active at work,  Diet discussed  -- labs: BMP, FLP, CBC, A1c, micro, PSA

## 2018-10-23 NOTE — Progress Notes (Signed)
Pre visit review using our clinic review tool, if applicable. No additional management support is needed unless otherwise documented below in the visit note. 

## 2018-10-24 LAB — MICROALBUMIN / CREATININE URINE RATIO
Creatinine,U: 118.5 mg/dL
Microalb Creat Ratio: 0.9 mg/g (ref 0.0–30.0)
Microalb, Ur: 1.1 mg/dL (ref 0.0–1.9)

## 2018-10-24 NOTE — Assessment & Plan Note (Signed)
Here for CPX DM: Last A1c was 7.2 while on Januvia, metformin was added, no side effects.  No recent ambulatory CBGs, recommend to check ambulatory CBGs, checking labs.Marland Kitchen HTN: Currently on amlodipine, carvedilol, HCTZ.  BP today 138/93, recommend ambulatory BPs. Hyperlipidemia: Last LDL 83.  Rechecking today (not fasting), continue Lipitor. RTC 4 months

## 2018-11-07 MED ORDER — METFORMIN HCL 850 MG PO TABS: ORAL_TABLET | ORAL | 6 refills | Status: DC

## 2018-11-07 MED FILL — metFORMIN HCL 850 MG TABS: 850 | 30 days supply | Qty: 90 | Fill #0

## 2018-11-07 NOTE — Addendum Note (Signed)
Addended byDamita Dunnings D on: 11/07/2018 03:20 PM   Modules accepted: Orders

## 2018-11-14 ENCOUNTER — Other Ambulatory Visit: Payer: Self-pay | Admitting: Internal Medicine

## 2018-11-14 MED FILL — HYDROCHLOROTHIAZIDE 25 MG T: 25 | 90 days supply | Qty: 45 | Fill #0

## 2018-11-25 ENCOUNTER — Other Ambulatory Visit: Payer: Self-pay | Admitting: Internal Medicine

## 2018-11-25 MED FILL — JANUVIA 100 MG TABLET: 100 | 90 days supply | Qty: 90 | Fill #0

## 2018-11-25 MED FILL — metFORMIN HCL 850 MG TABS: 850 | 30 days supply | Qty: 60 | Fill #0

## 2018-11-25 MED FILL — AMLODIPINE BESYLATE 5 MG TA: 5 | 90 days supply | Qty: 90 | Fill #0

## 2018-11-29 ENCOUNTER — Other Ambulatory Visit: Payer: Self-pay | Admitting: Internal Medicine

## 2018-11-29 MED FILL — CARVEDILOL 6.25 MG TABLET: 6.25 | 90 days supply | Qty: 180 | Fill #0

## 2019-01-21 MED FILL — metFORMIN HCL 850 MG TABS: 850 | 30 days supply | Qty: 60 | Fill #1

## 2019-02-10 MED FILL — HYDROCHLOROTHIAZIDE 25 MG T: 25 | 90 days supply | Qty: 45 | Fill #1

## 2019-02-25 ENCOUNTER — Other Ambulatory Visit: Payer: Self-pay | Admitting: Internal Medicine

## 2019-02-25 MED FILL — AMLODIPINE BESYLATE 5 MG TA: 5 | 90 days supply | Qty: 90 | Fill #1

## 2019-02-25 MED FILL — JANUVIA 100 MG TABLET: 100 | 60 days supply | Qty: 60 | Fill #1

## 2019-02-26 MED FILL — ATORVASTATIN 20 MG TABLET: 20 | 90 days supply | Qty: 90 | Fill #0

## 2019-03-03 ENCOUNTER — Other Ambulatory Visit: Payer: Self-pay | Admitting: Internal Medicine

## 2019-03-03 MED FILL — SILDENAFIL CITRATE 100 MG T: 100 | 30 days supply | Qty: 6 | Fill #0

## 2019-03-03 MED FILL — CARVEDILOL 6.25 MG TABLET: 6.25 | 90 days supply | Qty: 180 | Fill #1

## 2019-03-13 MED FILL — HYDROCORTISONE 2.5% CREAM: 2.5 | 10 days supply | Qty: 30 | Fill #1

## 2019-03-24 MED FILL — metFORMIN HCL 850 MG TABS: 850 | 30 days supply | Qty: 60 | Fill #2

## 2019-05-02 ENCOUNTER — Other Ambulatory Visit: Payer: Self-pay | Admitting: Internal Medicine

## 2019-05-02 MED FILL — HYDROCHLOROTHIAZIDE 25 MG T: 25 | 30 days supply | Qty: 15 | Fill #0

## 2019-05-02 MED FILL — JANUVIA 100 MG TABLET: 100 | 30 days supply | Qty: 30 | Fill #2

## 2019-05-24 MED FILL — metFORMIN HCL 850 MG TABS: 850 | 30 days supply | Qty: 60 | Fill #3

## 2019-05-29 ENCOUNTER — Other Ambulatory Visit: Payer: Self-pay | Admitting: Internal Medicine

## 2019-05-29 MED FILL — AMLODIPINE BESYLATE 5 MG TA: 5 | 30 days supply | Qty: 30 | Fill #0

## 2019-05-29 MED FILL — ATORVASTATIN 20 MG TABLET: 20 | 90 days supply | Qty: 90 | Fill #1

## 2019-06-02 ENCOUNTER — Other Ambulatory Visit: Payer: Self-pay | Admitting: Internal Medicine

## 2019-06-02 MED FILL — JANUVIA 100 MG TABLET: 100 | 30 days supply | Qty: 30 | Fill #0

## 2019-06-05 ENCOUNTER — Other Ambulatory Visit: Payer: Self-pay | Admitting: Internal Medicine

## 2019-06-05 MED FILL — HYDROCHLOROTHIAZIDE 25 MG T: 25 | 30 days supply | Qty: 15 | Fill #0

## 2019-06-05 MED FILL — CARVEDILOL 6.25 MG TABLET: 6.25 | 90 days supply | Qty: 180 | Fill #0

## 2019-06-26 ENCOUNTER — Other Ambulatory Visit: Payer: Self-pay | Admitting: Internal Medicine

## 2019-07-04 ENCOUNTER — Encounter: Payer: Self-pay | Admitting: Internal Medicine

## 2019-07-04 ENCOUNTER — Other Ambulatory Visit: Payer: Self-pay | Admitting: Internal Medicine

## 2019-07-04 ENCOUNTER — Other Ambulatory Visit: Payer: Self-pay

## 2019-07-04 ENCOUNTER — Ambulatory Visit: Payer: 59 | Admitting: Internal Medicine

## 2019-07-04 VITALS — BP 141/87 | HR 71 | Temp 96.9°F | Resp 18 | Ht 66.0 in | Wt 198.5 lb

## 2019-07-04 DIAGNOSIS — E119 Type 2 diabetes mellitus without complications: Secondary | ICD-10-CM | POA: Diagnosis not present

## 2019-07-04 DIAGNOSIS — Z9119 Patient's noncompliance with other medical treatment and regimen: Secondary | ICD-10-CM | POA: Diagnosis not present

## 2019-07-04 DIAGNOSIS — Z91199 Patient's noncompliance with other medical treatment and regimen due to unspecified reason: Secondary | ICD-10-CM

## 2019-07-04 DIAGNOSIS — R21 Rash and other nonspecific skin eruption: Secondary | ICD-10-CM

## 2019-07-04 DIAGNOSIS — I10 Essential (primary) hypertension: Secondary | ICD-10-CM

## 2019-07-04 LAB — COMPREHENSIVE METABOLIC PANEL
ALT: 38 U/L (ref 0–53)
AST: 22 U/L (ref 0–37)
Albumin: 4.9 g/dL (ref 3.5–5.2)
Alkaline Phosphatase: 41 U/L (ref 39–117)
BUN: 15 mg/dL (ref 6–23)
CO2: 34 mEq/L — ABNORMAL HIGH (ref 19–32)
Calcium: 10.3 mg/dL (ref 8.4–10.5)
Chloride: 100 mEq/L (ref 96–112)
Creatinine, Ser: 0.71 mg/dL (ref 0.40–1.50)
GFR: 138.17 mL/min (ref >60.00)
Glucose, Bld: 96 mg/dL (ref 70–99)
Potassium: 4.7 mEq/L (ref 3.5–5.1)
Sodium: 136 mEq/L (ref 135–145)
Total Bilirubin: 0.4 mg/dL (ref 0.2–1.2)
Total Protein: 7.7 g/dL (ref 6.0–8.3)

## 2019-07-04 LAB — HEMOGLOBIN A1C: Hgb A1c MFr Bld: 7.1 % — ABNORMAL HIGH (ref 4.6–6.5)

## 2019-07-04 MED ORDER — HYDROCORTISONE 2.5 % EX CREA: TOPICAL_CREAM | CUTANEOUS | 1 refills | Status: DC

## 2019-07-04 MED FILL — HYDROCORTISONE 2.5% CREAM: 2.5 | 10 days supply | Qty: 28 | Fill #0

## 2019-07-04 NOTE — Progress Notes (Signed)
Pre visit review using our clinic review tool, if applicable. No additional management support is needed unless otherwise documented below in the visit note. 

## 2019-07-04 NOTE — Patient Instructions (Addendum)
Per our records you are due for an eye exam. Please contact your eye doctor to schedule an appointment. Please have them send copies of your office visit notes to [place]. Our fax number is [phone].  tomese la presion una vez por semana Su presion debe ser: 110/65 and  135/85. Me llama si no esta bien controlada  Tome sus medicinas de acuerdo a la lista  llame a la farmacia si las medicinas estan por acabarse para mandarle un refill  regrese en 3 meses      GO TO THE LAB : Get the blood work     GO TO THE FRONT DESK, PLEASE SCHEDULE YOUR APPOINTMENTS Come back for   A check up in 3 months
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
## 2019-07-05 ENCOUNTER — Telehealth: Payer: Self-pay | Admitting: Internal Medicine

## 2019-07-06 NOTE — Assessment & Plan Note (Signed)
DM: Last A1c was 7.1, I was under the impression that he was taking Metformin twice a day and I increased it to 3 tablets a day however today the patient tells me that he is taking only 1 Metformin daily.  Reports he is taking Januvia. Feet exam today is normal He checks his blood sugars regularly, could not tell me any readings Plan: Check A1c.  Adjust medication if needed HTN: Reports good compliance with amlodipine, carvedilol, HCTZ, check BPs sometimes, could not tell me any readings but he said they are okay. Check a CMP Rash: As described above, typically in the summertime, on and off.  Refill hydrocortisone 2.5% cream to be used as needed Compliance: The patient is not certain about ambulatory CBGs, BPs.  Apparently he did not get my message regarding his last labs.  Not taking medication as recommended. We agreed will communicate first by calling to his cell phone in [place] and then sending a MyChart message, encouraged to check MyChart regularly   He reports he is able to read a Spanish well. Instructions printed in [place], recommend good med compliance BP checks Preventive Care: Had Covid shots RTC 3 months

## 2019-07-07 ENCOUNTER — Other Ambulatory Visit: Payer: Self-pay | Admitting: Internal Medicine

## 2019-07-07 MED ORDER — METFORMIN HCL 850 MG PO TABS: 850.0000 mg | ORAL_TABLET | Freq: Two times a day (BID) | ORAL | 1 refills | Status: DC

## 2019-07-07 MED FILL — JANUVIA 100 MG TABLET: 100 | 30 days supply | Qty: 30 | Fill #0

## 2019-07-07 MED FILL — AMLODIPINE BESYLATE 5 MG TA: 5 | 90 days supply | Qty: 90 | Fill #0

## 2019-07-07 MED FILL — METFORMIN HCL 850 MG TABS: 850 | 90 days supply | Qty: 180 | Fill #0

## 2019-07-07 NOTE — Telephone Encounter (Signed)
Pt requesting refills. He had labs on 07/04/2019. Please advise.

## 2019-07-07 NOTE — Telephone Encounter (Signed)
Please call patient in Spanish: Needs better diabetes control, send Metformin 850 mg 1 tablet twice a day, 56-month supply.

## 2019-07-07 NOTE — Telephone Encounter (Signed)
Results given to patient in [place], he verbalized understanding

## 2019-07-11 ENCOUNTER — Other Ambulatory Visit: Payer: Self-pay | Admitting: Internal Medicine

## 2019-07-11 MED FILL — HYDROCHLOROTHIAZIDE 25 MG T: 25 | 30 days supply | Qty: 15 | Fill #0

## 2019-07-18 DIAGNOSIS — H2513 Age-related nuclear cataract, bilateral: Secondary | ICD-10-CM | POA: Diagnosis not present

## 2019-07-18 DIAGNOSIS — H5203 Hypermetropia, bilateral: Secondary | ICD-10-CM | POA: Diagnosis not present

## 2019-07-18 DIAGNOSIS — H524 Presbyopia: Secondary | ICD-10-CM | POA: Diagnosis not present

## 2019-07-18 DIAGNOSIS — Z794 Long term (current) use of insulin: Secondary | ICD-10-CM | POA: Diagnosis not present

## 2019-07-18 DIAGNOSIS — H52203 Unspecified astigmatism, bilateral: Secondary | ICD-10-CM | POA: Diagnosis not present

## 2019-07-18 DIAGNOSIS — E1136 Type 2 diabetes mellitus with diabetic cataract: Secondary | ICD-10-CM | POA: Diagnosis not present

## 2019-07-18 LAB — HM DIABETES EYE EXAM

## 2019-07-31 ENCOUNTER — Encounter: Payer: Self-pay | Admitting: Internal Medicine

## 2019-08-06 MED FILL — JANUVIA 100 MG TABLET: 100 | 30 days supply | Qty: 30 | Fill #1

## 2019-08-14 ENCOUNTER — Other Ambulatory Visit: Payer: Self-pay | Admitting: Internal Medicine

## 2019-08-14 MED FILL — HYDROCHLOROTHIAZIDE 25 MG T: 25 | 90 days supply | Qty: 45 | Fill #0

## 2019-09-02 ENCOUNTER — Other Ambulatory Visit: Payer: Self-pay | Admitting: Internal Medicine

## 2019-09-02 MED FILL — ATORVASTATIN 20 MG TABLET: 20 | 90 days supply | Qty: 90 | Fill #0

## 2019-09-02 MED FILL — JANUVIA 100 MG TABLET: 100 | 30 days supply | Qty: 30 | Fill #2

## 2019-09-08 MED FILL — CARVEDILOL 6.25 MG TABLET: 6.25 | 90 days supply | Qty: 180 | Fill #1

## 2019-10-11 MED FILL — JANUVIA 100 MG TABLET: 100 | 30 days supply | Qty: 30 | Fill #3

## 2019-10-11 MED FILL — AMLODIPINE BESYLATE 5 MG TA: 5 | 90 days supply | Qty: 90 | Fill #1

## 2019-11-06 MED FILL — METFORMIN HCL 850 MG TABS: 850 | 90 days supply | Qty: 180 | Fill #1

## 2019-11-06 MED FILL — HYDROCHLOROTHIAZIDE 25 MG T: 25 | 90 days supply | Qty: 45 | Fill #1

## 2019-11-10 MED FILL — JANUVIA 100 MG TABLET: 100 | 30 days supply | Qty: 30 | Fill #4

## 2019-12-09 ENCOUNTER — Other Ambulatory Visit: Payer: Self-pay | Admitting: Internal Medicine

## 2019-12-09 MED FILL — ATORVASTATIN CALCIUM 20 MG: 20 | 90 days supply | Qty: 90 | Fill #1

## 2019-12-09 MED FILL — JANUVIA 100 MG TABLET: 100 | 30 days supply | Qty: 30 | Fill #5

## 2019-12-09 MED FILL — CARVEDILOL 6.25 MG TABLET: 6.25 | 30 days supply | Qty: 60 | Fill #0

## 2019-12-09 MED FILL — HYDROCORTISONE 2.5% CREAM: 2.5 | 10 days supply | Qty: 28 | Fill #1

## 2020-01-12 ENCOUNTER — Other Ambulatory Visit: Payer: Self-pay | Admitting: Internal Medicine

## 2020-01-12 MED FILL — AMLODIPINE BESYLATE 5 MG TA: 5 | 30 days supply | Qty: 30 | Fill #0

## 2020-01-12 MED FILL — CARVEDILOL 6.25 MG TABLET: 6.25 | 30 days supply | Qty: 60 | Fill #0

## 2020-01-12 MED FILL — JANUVIA 100 MG TABLET: 100 | 30 days supply | Qty: 30 | Fill #0

## 2020-02-03 ENCOUNTER — Other Ambulatory Visit: Payer: Self-pay | Admitting: Internal Medicine

## 2020-02-03 MED FILL — HYDROCHLOROTHIAZIDE 25 MG T: 25 | 30 days supply | Qty: 15 | Fill #0

## 2020-02-12 ENCOUNTER — Other Ambulatory Visit: Payer: Self-pay | Admitting: Internal Medicine

## 2020-02-12 MED FILL — CARVEDILOL 6.25 MG TABLET: 6.25 | 15 days supply | Qty: 30 | Fill #0

## 2020-02-12 MED FILL — JANUVIA 100 MG TABLET: 100 | 15 days supply | Qty: 15 | Fill #0

## 2020-02-12 MED FILL — AMLODIPINE BESYLATE 5 MG TA: 5 | 15 days supply | Qty: 15 | Fill #0

## 2020-02-26 ENCOUNTER — Other Ambulatory Visit: Payer: Self-pay | Admitting: Internal Medicine

## 2020-02-26 ENCOUNTER — Telehealth: Payer: Self-pay | Admitting: Internal Medicine

## 2020-02-26 MED ORDER — SITAGLIPTIN PHOSPHATE 100 MG PO TABS
100.0000 mg | ORAL_TABLET | Freq: Every day | ORAL | 0 refills | Status: DC
Start: 2020-02-26 — End: 2020-03-02

## 2020-02-26 MED ORDER — HYDROCHLOROTHIAZIDE 25 MG PO TABS
12.5000 mg | ORAL_TABLET | Freq: Every day | ORAL | 0 refills | Status: DC
Start: 2020-02-26 — End: 2020-03-02

## 2020-02-26 MED ORDER — CARVEDILOL 6.25 MG PO TABS
6.2500 mg | ORAL_TABLET | Freq: Two times a day (BID) | ORAL | 0 refills | Status: DC
Start: 2020-02-26 — End: 2020-03-02

## 2020-02-26 MED FILL — CARVEDILOL 6.25 MG TABLET: 6.25 | 30 days supply | Qty: 60 | Fill #0

## 2020-02-26 NOTE — Telephone Encounter (Signed)
Caller : Todd Newman  Call Back # [phone]  Patient states he need a few pills to carry him until next Tuesday 03/02/2020 when his appointment is scheduled to follow up with Dr Larose Kells   Patient needs : sitaGLIPtin (JANUVIA) 100 MG tablet [[id number]]   hydrochlorothiazide (HYDRODIURIL) 25 MG tablet [[id number]  carvedilol (COREG) 6.25 MG tablet [[id number]]   [place], [place] - [address]  [place], [place] [address]  Phone:  [phone] Fax:  [phone]  DEA #:  --

## 2020-02-26 NOTE — Telephone Encounter (Signed)
30 day supply sent

## 2020-02-27 MED FILL — HYDROCHLOROTHIAZIDE 25 MG T: 25 | 30 days supply | Qty: 15 | Fill #0

## 2020-02-27 MED FILL — JANUVIA 100 MG TABLET: 100 | 30 days supply | Qty: 30 | Fill #0

## 2020-03-02 ENCOUNTER — Other Ambulatory Visit: Payer: Self-pay | Admitting: Internal Medicine

## 2020-03-02 ENCOUNTER — Other Ambulatory Visit: Payer: Self-pay

## 2020-03-02 ENCOUNTER — Encounter: Payer: Self-pay | Admitting: Internal Medicine

## 2020-03-02 ENCOUNTER — Ambulatory Visit: Payer: 59 | Admitting: Internal Medicine

## 2020-03-02 VITALS — BP 137/82 | HR 86 | Temp 98.3°F | Resp 16 | Ht 66.0 in | Wt 200.0 lb

## 2020-03-02 DIAGNOSIS — I10 Essential (primary) hypertension: Secondary | ICD-10-CM

## 2020-03-02 DIAGNOSIS — E119 Type 2 diabetes mellitus without complications: Secondary | ICD-10-CM

## 2020-03-02 DIAGNOSIS — E785 Hyperlipidemia, unspecified: Secondary | ICD-10-CM

## 2020-03-02 MED ORDER — CARVEDILOL 6.25 MG PO TABS: 6.2500 mg | ORAL_TABLET | Freq: Two times a day (BID) | ORAL | 1 refills | Status: DC

## 2020-03-02 MED ORDER — AMLODIPINE BESYLATE 5 MG PO TABS: 5.0000 mg | ORAL_TABLET | Freq: Every day | ORAL | 1 refills | Status: DC

## 2020-03-02 MED ORDER — METFORMIN HCL 850 MG PO TABS: 850.0000 mg | ORAL_TABLET | Freq: Two times a day (BID) | ORAL | 1 refills | Status: DC

## 2020-03-02 MED ORDER — SITAGLIPTIN PHOSPHATE 100 MG PO TABS: 100.0000 mg | ORAL_TABLET | Freq: Every day | ORAL | 1 refills | Status: DC

## 2020-03-02 MED ORDER — HYDROCHLOROTHIAZIDE 25 MG PO TABS: 12.5000 mg | ORAL_TABLET | Freq: Every day | ORAL | 1 refills | Status: DC

## 2020-03-02 MED ORDER — ATORVASTATIN CALCIUM 20 MG PO TABS: 20.0000 mg | ORAL_TABLET | Freq: Every day | ORAL | 1 refills | Status: DC

## 2020-03-02 MED FILL — METFORMIN HCL 850 MG TABS: 850 | 90 days supply | Qty: 180 | Fill #0

## 2020-03-02 MED FILL — AMLODIPINE BESYLATE 5 MG TA: 5 | 90 days supply | Qty: 90 | Fill #0

## 2020-03-02 MED FILL — ATORVASTATIN CALCIUM 20 MG: 20 | 90 days supply | Qty: 90 | Fill #0

## 2020-03-02 NOTE — Progress Notes (Signed)
Pre visit review using our clinic review tool, if applicable. No additional management support is needed unless otherwise documented below in the visit note. 
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
## 2020-03-02 NOTE — Assessment & Plan Note (Signed)
DM: Last A1c 7.1, was rec metformin 850 mg twice daily.  He is also on Januvia, reports good compliance except he ran out of metformin 5 days ago. Feet exam negative. Plan: Check A1c, micro. HTN: Last CMP satisfactory, continue amlodipine, carvedilol, HCTZ. Checking a micro today. Never tried and is not taking a ACE or ARB.  BP seem to be very good.  No change for now. High cholesterol: On atorvastatin, not fasting, check a lipid panel. Preventive care: Had COVID-vaccine x3. Multiple refills sent. RTC 3 months

## 2020-03-02 NOTE — Patient Instructions (Signed)
Check the  blood pressure 2  times a month  BP GOAL is between 110/65 and  135/85. If it is consistently higher or lower, let me know     GO TO THE LAB : Get the blood work     [place], [place] back for   a checkup in 3 months

## 2020-03-03 LAB — BASIC METABOLIC PANEL
BUN: 15 mg/dL (ref 6–23)
CO2: 33 mEq/L — ABNORMAL HIGH (ref 19–32)
Calcium: 10.2 mg/dL (ref 8.4–10.5)
Chloride: 100 mEq/L (ref 96–112)
Creatinine, Ser: 1.17 mg/dL (ref 0.40–1.50)
GFR: 68.93 mL/min (ref >60.00)
Glucose, Bld: 160 mg/dL — ABNORMAL HIGH (ref 70–99)
Potassium: 4.6 mEq/L (ref 3.5–5.1)
Sodium: 139 mEq/L (ref 135–145)

## 2020-03-03 LAB — LIPID PANEL
Cholesterol: 145 mg/dL (ref 0–200)
HDL: 43.2 mg/dL (ref >39.00)
NonHDL: 102
Total CHOL/HDL Ratio: 3
Triglycerides: 203 mg/dL — ABNORMAL HIGH (ref 0.0–149.0)
VLDL: 40.6 mg/dL — ABNORMAL HIGH (ref 0.0–40.0)

## 2020-03-03 LAB — MICROALBUMIN / CREATININE URINE RATIO
Creatinine,U: 139.1 mg/dL
Microalb Creat Ratio: 0.6 mg/g (ref 0.0–30.0)
Microalb, Ur: 0.8 mg/dL (ref 0.0–1.9)

## 2020-03-03 LAB — LDL CHOLESTEROL, DIRECT: Direct LDL: 78 mg/dL

## 2020-03-03 LAB — HEMOGLOBIN A1C: Hgb A1c MFr Bld: 7.1 % — ABNORMAL HIGH (ref 4.6–6.5)

## 2020-03-04 ENCOUNTER — Other Ambulatory Visit: Payer: Self-pay

## 2020-03-04 MED ORDER — PIOGLITAZONE HCL 30 MG PO TABS: 30.0000 mg | ORAL_TABLET | Freq: Every day | ORAL | 2 refills | Status: DC

## 2020-03-04 MED FILL — PIOGLITAZONE HCL 30 MG TABS: 30 | 90 days supply | Qty: 90 | Fill #0

## 2020-03-31 MED FILL — HYDROCHLOROTHIAZIDE 25 MG T: 25 | 90 days supply | Qty: 45 | Fill #0

## 2020-03-31 MED FILL — CARVEDILOL 6.25 MG TABLET: 6.25 | 90 days supply | Qty: 180 | Fill #0

## 2020-03-31 MED FILL — JANUVIA 100 MG TABLET: 100 | 30 days supply | Qty: 30 | Fill #0

## 2020-05-03 MED FILL — JANUVIA 100 MG TABLET: 100 | 30 days supply | Qty: 30 | Fill #1

## 2020-06-01 ENCOUNTER — Encounter: Payer: Self-pay | Admitting: Internal Medicine

## 2020-06-01 ENCOUNTER — Ambulatory Visit: Payer: 59 | Admitting: Internal Medicine

## 2020-06-01 ENCOUNTER — Other Ambulatory Visit: Payer: Self-pay

## 2020-06-01 VITALS — BP 138/70 | HR 68 | Temp 97.6°F | Resp 16 | Ht 66.0 in | Wt 193.0 lb

## 2020-06-01 DIAGNOSIS — Z0000 Encounter for general adult medical examination without abnormal findings: Secondary | ICD-10-CM

## 2020-06-01 DIAGNOSIS — Z125 Encounter for screening for malignant neoplasm of prostate: Secondary | ICD-10-CM

## 2020-06-01 DIAGNOSIS — Z23 Encounter for immunization: Secondary | ICD-10-CM | POA: Diagnosis not present

## 2020-06-01 DIAGNOSIS — I10 Essential (primary) hypertension: Secondary | ICD-10-CM | POA: Diagnosis not present

## 2020-06-01 DIAGNOSIS — E119 Type 2 diabetes mellitus without complications: Secondary | ICD-10-CM | POA: Diagnosis not present

## 2020-06-01 DIAGNOSIS — Z1211 Encounter for screening for malignant neoplasm of colon: Secondary | ICD-10-CM

## 2020-06-01 LAB — COMPREHENSIVE METABOLIC PANEL
ALT: 30 U/L (ref 0–53)
AST: 17 U/L (ref 0–37)
Albumin: 4.5 g/dL (ref 3.5–5.2)
Alkaline Phosphatase: 36 U/L — ABNORMAL LOW (ref 39–117)
BUN: 14 mg/dL (ref 6–23)
CO2: 33 mEq/L — ABNORMAL HIGH (ref 19–32)
Calcium: 10.2 mg/dL (ref 8.4–10.5)
Chloride: 100 mEq/L (ref 96–112)
Creatinine, Ser: 0.68 mg/dL (ref 0.40–1.50)
GFR: 102.6 mL/min (ref >60.00)
Glucose, Bld: 121 mg/dL — ABNORMAL HIGH (ref 70–99)
Potassium: 4.7 mEq/L (ref 3.5–5.1)
Sodium: 139 mEq/L (ref 135–145)
Total Bilirubin: 0.4 mg/dL (ref 0.2–1.2)
Total Protein: 7.5 g/dL (ref 6.0–8.3)

## 2020-06-01 LAB — CBC WITH DIFFERENTIAL/PLATELET
Basophils Absolute: 0 10*3/uL (ref 0.0–0.1)
Basophils Relative: 0.6 % (ref 0.0–3.0)
Eosinophils Absolute: 0.1 10*3/uL (ref 0.0–0.7)
Eosinophils Relative: 1.8 % (ref 0.0–5.0)
HCT: 42.2 % (ref 39.0–52.0)
Hemoglobin: 14.4 g/dL (ref 13.0–17.0)
Lymphocytes Relative: 20.5 % (ref 12.0–46.0)
Lymphs Abs: 1.3 10*3/uL (ref 0.7–4.0)
MCHC: 34 g/dL (ref 30.0–36.0)
MCV: 90.2 fl (ref 78.0–100.0)
Monocytes Absolute: 0.7 10*3/uL (ref 0.1–1.0)
Monocytes Relative: 12.2 % — ABNORMAL HIGH (ref 3.0–12.0)
Neutro Abs: 4 10*3/uL (ref 1.4–7.7)
Neutrophils Relative %: 64.9 % (ref 43.0–77.0)
Platelets: 335 10*3/uL (ref 150.0–400.0)
RBC: 4.68 Mil/uL (ref 4.22–5.81)
RDW: 14.4 % (ref 11.5–15.5)
WBC: 6.1 10*3/uL (ref 4.0–10.5)

## 2020-06-01 LAB — HEMOGLOBIN A1C: Hgb A1c MFr Bld: 6.8 % — ABNORMAL HIGH (ref 4.6–6.5)

## 2020-06-01 LAB — PSA: PSA: 1.02 ng/mL (ref 0.10–4.00)

## 2020-06-01 NOTE — Patient Instructions (Addendum)
llame a la oficina de gastroenterologia para hacer una cita para su colonoscopia  [id number])  regrese en 2 o 3 meses para ponerse su segunda vacuna de la [place] (se llama Shingrix)  Le recomiendo se ponga la 4ta vacuna de covid     Check the  blood pressure  BP GOAL is between 110/65 and  135/85. If it is consistently higher or lower, let me know     GO TO THE LAB : Get the blood work     [place], PLEASE SCHEDULE YOUR APPOINTMENTS Come back in 2 to 3 months for Shingrix No. 2  Come back for a checkup in 4 months
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
## 2020-06-02 ENCOUNTER — Encounter: Payer: Self-pay | Admitting: Internal Medicine

## 2020-06-02 NOTE — Assessment & Plan Note (Signed)
--  Tdat 10/2018 - PNM 23:  2015;  prevnar 2017  -[place] x3, booster discussed and recommended, he works at the hospital - shingrix: Elected to proceed, come back in 2 to 3 months for the second dose --  + FHf heart disease, plan is to control cardiovascular risk factors. On  ASA  -- CCS:  Colonoscopy 09-2015, 5 years, GI referral sent, patient to call. --Prostate cancer screening: DRE normal, no symptoms, check a PSA. --Diet and exercise: Eats healthy, very active at work. -- labs:CMP, CBC, A1c, PSA

## 2020-06-02 NOTE — Assessment & Plan Note (Signed)
Here for CPX JK:KXFG A1c was 7.1, he was on metformin, Januvia.  Actos was added.  Ambulatory CBGs in the low 100s to 130.  Check A1c. HTN: Good compliance with amlodipine, HCTZ, BP today is very good.  Checking labs High cholesterol: Controlled on Lipitor. RTC 4 months

## 2020-06-03 ENCOUNTER — Other Ambulatory Visit (HOSPITAL_COMMUNITY): Payer: Self-pay

## 2020-06-03 MED FILL — Pioglitazone HCl Tab 30 MG (Base Equiv): ORAL | 90 days supply | Qty: 90 | Fill #0 | Status: AC

## 2020-06-03 MED FILL — Amlodipine Besylate Tab 5 MG (Base Equivalent): ORAL | 90 days supply | Qty: 90 | Fill #0 | Status: AC

## 2020-06-03 MED FILL — Sitagliptin Phosphate Tab 100 MG (Base Equiv): ORAL | 30 days supply | Qty: 30 | Fill #0 | Status: AC

## 2020-06-14 ENCOUNTER — Other Ambulatory Visit (HOSPITAL_COMMUNITY): Payer: Self-pay

## 2020-06-14 MED FILL — Atorvastatin Calcium Tab 20 MG (Base Equivalent): ORAL | 90 days supply | Qty: 90 | Fill #0 | Status: AC

## 2020-06-22 ENCOUNTER — Encounter: Payer: Self-pay | Admitting: Gastroenterology

## 2020-07-01 ENCOUNTER — Other Ambulatory Visit (HOSPITAL_COMMUNITY): Payer: Self-pay

## 2020-07-01 MED FILL — Sitagliptin Phosphate Tab 100 MG (Base Equiv): ORAL | 30 days supply | Qty: 30 | Fill #1 | Status: AC

## 2020-07-01 MED FILL — Hydrochlorothiazide Tab 25 MG: ORAL | 90 days supply | Qty: 45 | Fill #0 | Status: AC

## 2020-07-01 MED FILL — Carvedilol Tab 6.25 MG: ORAL | 90 days supply | Qty: 180 | Fill #0 | Status: AC

## 2020-07-02 ENCOUNTER — Other Ambulatory Visit: Payer: Self-pay | Admitting: Internal Medicine

## 2020-07-02 ENCOUNTER — Other Ambulatory Visit (HOSPITAL_COMMUNITY): Payer: Self-pay

## 2020-07-02 MED ORDER — FREESTYLE LITE TEST VI STRP
ORAL_STRIP | Freq: Every day | 11 refills | Status: AC
  Filled 2020-07-02: qty 100, 90d supply, fill #0

## 2020-07-03 ENCOUNTER — Other Ambulatory Visit (HOSPITAL_COMMUNITY): Payer: Self-pay

## 2020-07-06 ENCOUNTER — Other Ambulatory Visit (HOSPITAL_COMMUNITY): Payer: Self-pay

## 2020-07-13 ENCOUNTER — Other Ambulatory Visit (HOSPITAL_COMMUNITY): Payer: Self-pay

## 2020-07-13 MED FILL — Metformin HCl Tab 850 MG: ORAL | 90 days supply | Qty: 180 | Fill #0 | Status: AC

## 2020-07-19 DIAGNOSIS — H2513 Age-related nuclear cataract, bilateral: Secondary | ICD-10-CM | POA: Diagnosis not present

## 2020-07-19 DIAGNOSIS — H524 Presbyopia: Secondary | ICD-10-CM | POA: Diagnosis not present

## 2020-07-19 DIAGNOSIS — Z794 Long term (current) use of insulin: Secondary | ICD-10-CM | POA: Diagnosis not present

## 2020-07-19 DIAGNOSIS — H52203 Unspecified astigmatism, bilateral: Secondary | ICD-10-CM | POA: Diagnosis not present

## 2020-07-19 DIAGNOSIS — H5203 Hypermetropia, bilateral: Secondary | ICD-10-CM | POA: Diagnosis not present

## 2020-07-19 DIAGNOSIS — E1136 Type 2 diabetes mellitus with diabetic cataract: Secondary | ICD-10-CM | POA: Diagnosis not present

## 2020-07-19 LAB — HM DIABETES EYE EXAM

## 2020-07-23 ENCOUNTER — Encounter: Payer: Self-pay | Admitting: Internal Medicine

## 2020-08-03 ENCOUNTER — Other Ambulatory Visit: Payer: Self-pay

## 2020-08-03 ENCOUNTER — Ambulatory Visit (INDEPENDENT_AMBULATORY_CARE_PROVIDER_SITE_OTHER): Payer: 59

## 2020-08-03 DIAGNOSIS — Z23 Encounter for immunization: Secondary | ICD-10-CM | POA: Diagnosis not present

## 2020-08-03 NOTE — Progress Notes (Signed)
Patient here today for shingrx vaccine. 0.21mL given in left deltoid IM. Patient tolerated well. VIS given.

## 2020-08-04 ENCOUNTER — Other Ambulatory Visit: Payer: Self-pay | Admitting: Internal Medicine

## 2020-08-04 ENCOUNTER — Other Ambulatory Visit (HOSPITAL_COMMUNITY): Payer: Self-pay

## 2020-08-04 MED ORDER — FREESTYLE LITE W/DEVICE KIT
PACK | 0 refills | Status: AC
  Filled 2020-08-04: qty 1, 30d supply, fill #0

## 2020-08-04 MED FILL — Sitagliptin Phosphate Tab 100 MG (Base Equiv): ORAL | 30 days supply | Qty: 30 | Fill #2 | Status: AC

## 2020-08-23 ENCOUNTER — Other Ambulatory Visit (HOSPITAL_COMMUNITY): Payer: Self-pay

## 2020-08-30 ENCOUNTER — Other Ambulatory Visit (HOSPITAL_COMMUNITY): Payer: Self-pay

## 2020-08-30 ENCOUNTER — Other Ambulatory Visit: Payer: Self-pay

## 2020-08-30 ENCOUNTER — Ambulatory Visit (AMBULATORY_SURGERY_CENTER): Payer: Self-pay | Admitting: *Deleted

## 2020-08-30 VITALS — Ht 66.0 in | Wt 201.0 lb

## 2020-08-30 DIAGNOSIS — Z1211 Encounter for screening for malignant neoplasm of colon: Secondary | ICD-10-CM

## 2020-08-30 MED ORDER — NA SULFATE-K SULFATE-MG SULF 17.5-3.13-1.6 GM/177ML PO SOLN
ORAL | 0 refills | Status: DC
  Filled 2020-08-30: qty 354, 1d supply, fill #0

## 2020-08-30 NOTE — Progress Notes (Signed)
Patient is here in-person for PV. Patient denies any allergies to eggs or soy. Patient denies any problems with anesthesia/sedation. Patient denies any oxygen use at home. Patient denies taking any diet/weight loss medications or blood thinners. Patient is not being treated for MRSA or C-diff. Patient is aware of our care-partner policy and [id number]-[id number] safety protocol. Patient is COVID-19 vaccinated, per patient. Patient was given english and spanish 2 day prep instructions.

## 2020-09-06 ENCOUNTER — Other Ambulatory Visit: Payer: Self-pay | Admitting: Internal Medicine

## 2020-09-06 ENCOUNTER — Other Ambulatory Visit (HOSPITAL_COMMUNITY): Payer: Self-pay

## 2020-09-06 MED FILL — Pioglitazone HCl Tab 30 MG (Base Equiv): ORAL | 90 days supply | Qty: 90 | Fill #1 | Status: AC

## 2020-09-06 MED FILL — Sitagliptin Phosphate Tab 100 MG (Base Equiv): ORAL | 30 days supply | Qty: 30 | Fill #3 | Status: AC

## 2020-09-07 ENCOUNTER — Other Ambulatory Visit (HOSPITAL_COMMUNITY): Payer: Self-pay

## 2020-09-07 MED ORDER — AMLODIPINE BESYLATE 5 MG PO TABS
5.0000 mg | ORAL_TABLET | Freq: Every day | ORAL | 1 refills | Status: DC
  Filled 2020-09-07: qty 90, 90d supply, fill #0

## 2020-09-13 ENCOUNTER — Other Ambulatory Visit: Payer: Self-pay

## 2020-09-13 ENCOUNTER — Ambulatory Visit (AMBULATORY_SURGERY_CENTER): Payer: 59 | Admitting: Gastroenterology

## 2020-09-13 ENCOUNTER — Encounter: Payer: Self-pay | Admitting: Gastroenterology

## 2020-09-13 VITALS — BP 149/96 | HR 192 | Temp 98.6°F | Resp 14 | Ht 66.0 in | Wt 201.0 lb

## 2020-09-13 DIAGNOSIS — D123 Benign neoplasm of transverse colon: Secondary | ICD-10-CM

## 2020-09-13 DIAGNOSIS — Z1211 Encounter for screening for malignant neoplasm of colon: Secondary | ICD-10-CM | POA: Diagnosis not present

## 2020-09-13 DIAGNOSIS — Z86010 Personal history of colonic polyps: Secondary | ICD-10-CM | POA: Diagnosis not present

## 2020-09-13 MED ORDER — SODIUM CHLORIDE 0.9 % IV SOLN
500.0000 mL | Freq: Once | INTRAVENOUS | Status: DC
Start: 2020-09-13 — End: 2020-09-13

## 2020-09-13 NOTE — Op Note (Signed)
[facility] Patient Name: Todd Newman Procedure Date: 09/13/2020 9:37 AM MRN: [id number] Endoscopist: Mallie Mussel L. Loletha Carrow , MD Age: 60 Referring MD:  Date of Birth: 1961-01-04 Gender: Male Account #: [id number] Procedure:                Colonoscopy Indications:              Screening for colorectal malignant neoplasm                           no adenomatous polyps 09/2015, fair bowel prep on                            that exam Medicines:                Monitored Anesthesia Care Procedure:                Pre-Anesthesia Assessment:                           - Prior to the procedure, a History and Physical                            was performed, and patient medications and                            allergies were reviewed. The patient's tolerance of                            previous anesthesia was also reviewed. The risks                            and benefits of the procedure and the sedation                            options and risks were discussed with the patient.                            All questions were answered, and informed consent                            was obtained. Prior Anticoagulants: The patient has                            taken no previous anticoagulant or antiplatelet                            agents. ASA Grade Assessment: II - A patient with                            mild systemic disease. After reviewing the risks                            and benefits, the patient was deemed in  satisfactory condition to undergo the procedure.                           After obtaining informed consent, the colonoscope                            was passed under direct vision. Throughout the                            procedure, the patient's blood pressure, pulse, and                            oxygen saturations were monitored continuously. The                            Olympus CF-HQ190L ([id number]) Colonoscope was                             introduced through the anus and advanced to the the                            cecum, identified by appendiceal orifice and                            ileocecal valve. The colonoscopy was performed                            without difficulty. The patient tolerated the                            procedure well. The quality of the bowel                            preparation was good. The ileocecal valve,                            appendiceal orifice, and rectum were photographed.                            The bowel preparation used was SUPREP. Scope In: 9:46:41 AM Scope Out: 10:02:00 AM Scope Withdrawal Time: 0 hours 11 minutes 40 seconds  Total Procedure Duration: 0 hours 15 minutes 19 seconds  Findings:                 The perianal and digital rectal examinations were                            normal.                           Multiple diverticula were found in the left colon                            and right colon.  Two semi-sessile polyps were found in the                            transverse colon. The polyps were diminutive in                            size. These polyps were removed with a cold snare.                            Resection and retrieval were complete.                           Internal hemorrhoids were found. The hemorrhoids                            were small.                           The exam was otherwise without abnormality on                            direct and retroflexion views. Complications:            No immediate complications. Estimated Blood Loss:     Estimated blood loss was minimal. Impression:               - Diverticulosis in the left colon and in the right                            colon.                           - Two diminutive polyps in the transverse colon,                            removed with a cold snare. Resected and retrieved.                           - Internal hemorrhoids.                            - The examination was otherwise normal on direct                            and retroflexion views. Recommendation:           - Patient has a contact number available for                            emergencies. The signs and symptoms of potential                            delayed complications were discussed with the                            patient. Return to normal activities tomorrow.  Written discharge instructions were provided to the                            patient.                           - Resume previous diet.                           - Continue present medications.                           - Await pathology results.                           - Repeat colonoscopy is recommended for                            surveillance. The colonoscopy date will be                            determined after pathology results from today's                            exam become available for review. Denyse Fillion L. Loletha Carrow, MD 09/13/2020 10:08:31 AM This report has been signed electronically.

## 2020-09-13 NOTE — Patient Instructions (Signed)
Await pathology- 2 polyps removed  Please read over handouts about polyps, diverticulosis and hemorrhoids Continue your normal medications  USTED TUVO UN PROCEDIMIENTO ENDOSCPICO HOY EN [facility]:   Lea el informe del procedimiento que se le entreg para cualquier pregunta especfica sobre lo que se Primary school teacher.  Si el informe del examen no responde a sus preguntas, por favor llame a su gastroenterlogo para aclararlo.  Si usted solicit que no se le den [employer] de lo que se Estate manager/land agent en su procedimiento al [employer] va a cuidar, entonces el informe del procedimiento se ha incluido en un sobre sellado para que usted lo revise despus cuando le sea ms conveniente.   LO QUE PUEDE ESPERAR: Algunas sensaciones de hinchazn en el abdomen.  Puede tener ms gases de lo normal.  El caminar puede ayudarle a eliminar el aire que se le puso en el tracto gastrointestinal durante el procedimiento y reducir la hinchazn.  Si le hicieron una endoscopia inferior (como una colonoscopia o una sigmoidoscopia flexible), podra notar manchas de sangre en las heces fecales o en el papel higinico.  Si se someti a una preparacin intestinal para su procedimiento, es posible que no tenga una evacuacin intestinal normal durante [employer].   Tenga en cuenta:  Es posible que note un poco de irritacin y congestin en la nariz o algn drenaje.  Esto es debido al oxgeno [employer] durante su procedimiento.  No hay que preocuparse y esto debe desaparecer ms o Scientist, research (medical).   SNTOMAS PARA REPORTAR INMEDIATAMENTE:  Despus de una endoscopia inferior (colonoscopia o sigmoidoscopia flexible):  Cantidades excesivas de sangre en las heces fecales  Sensibilidad significativa o empeoramiento de los dolores abdominales   Hinchazn aguda del abdomen que antes no tena   Fiebre de 100F o ms   Para asuntos urgentes o de Freight forwarder, puede comunicarse con un gastroenterlogo a cualquier  hora llamando al [phone].  DIETA:  Recomendamos una comida pequea al principio, pero luego puede continuar con su dieta normal.  Tome muchos lquidos, Teacher, adult education las bebidas alcohlicas durante 24 horas.    ACTIVIDAD:  Debe planear tomarse las cosas con calma por el resto del da y no debe CONDUCIR ni usar maquinaria pesada Programmer, applications (debido a los medicamentos de sedacin utilizados durante el examen).     SEGUIMIENTO: Nuestro personal llamar al nmero que aparece en su historial al siguiente da hbil de su procedimiento para ver cmo se siente y para responder cualquier pregunta o inquietud que pueda tener con respecto a la informacin que se le dio despus del procedimiento. Si no podemos contactarle, le dejaremos un mensaje.  Sin embargo, si se siente bien y no tiene Paediatric nurse, no es necesario que nos devuelva la llamada.  Asumiremos que ha regresado a sus actividades diarias normales sin incidentes. Si se le tomaron algunas biopsias, le contactaremos por telfono o por carta en las prximas 3 semanas.  Si no ha sabido [employer] biopsias en el transcurso de 3 semanas, por favor llmenos al [phone].   FIRMAS/CONFIDENCIALIDAD: Usted y/o el acompaante que le cuide han firmado documentos que se ingresarn en su historial mdico Emergency planning/management officer.  Estas firmas atestiguan el hecho de que la informacin anterior     YOU HAD AN ENDOSCOPIC PROCEDURE TODAY AT [facility]:   Refer to the procedure report that was given to you for any specific questions about what was found during the  examination.  If the procedure report does not answer your questions, please call your gastroenterologist to clarify.  If you requested that your care partner not be given the details of your procedure findings, then the procedure report has been included in a sealed envelope for you to review at your convenience later.  YOU SHOULD EXPECT: Some feelings of bloating in the  abdomen. Passage of more gas than usual.  Walking can help get rid of the air that was put into your GI tract during the procedure and reduce the bloating. If you had a lower endoscopy (such as a colonoscopy or flexible sigmoidoscopy) you may notice spotting of blood in your stool or on the toilet paper. If you underwent a bowel prep for your procedure, you may not have a normal bowel movement for a few days.  Please Note:  You might notice some irritation and congestion in your nose or some drainage.  This is from the oxygen used during your procedure.  There is no need for concern and it should clear up in a day or so.  SYMPTOMS TO REPORT IMMEDIATELY:  Following lower endoscopy (colonoscopy or flexible sigmoidoscopy):  Excessive amounts of blood in the stool  Significant tenderness or worsening of abdominal pains  Swelling of the abdomen that is new, acute  Fever of 100F or higher  For urgent or emergent issues, a gastroenterologist can be reached at any hour by calling [phone]. Do not use MyChart messaging for urgent concerns.    DIET:  We do recommend a small meal at first, but then you may proceed to your regular diet.  Drink plenty of fluids but you should avoid alcoholic beverages for 24 hours.  ACTIVITY:  You should plan to take it easy for the rest of today and you should NOT DRIVE or use heavy machinery until tomorrow (because of the sedation medicines used during the test).    FOLLOW UP: Our staff will call the number listed on your records 48-72 hours following your procedure to check on you and address any questions or concerns that you may have regarding the information given to you following your procedure. If we do not reach you, we will leave a message.  We will attempt to reach you two times.  During this call, we will ask if you have developed any symptoms of COVID 19. If you develop any symptoms (ie: fever, flu-like symptoms, shortness of breath, cough etc.) before  then, please call [phone].  If you test positive for Covid 19 in the 2 weeks post procedure, please call and report this information to [place].    If any biopsies were taken you will be contacted by phone or by letter within the next 1-3 weeks.  Please call us at [phone] if you have not heard about the biopsies in 3 weeks.    SIGNATURES/CONFIDENTIALITY: You and/or your care partner have signed paperwork which will be entered into your electronic medical record.  These signatures attest to the fact that that the information above on your After Visit Summary has been reviewed and is understood.  Full responsibility of the confidentiality of this discharge information lies with you and/or your care-partner.

## 2020-09-13 NOTE — Progress Notes (Signed)
pt tolerated well. VSS. awake and to recovery. Report given to RN.  

## 2020-09-13 NOTE — Progress Notes (Signed)
Called to room to assist during endoscopic procedure.  Patient ID and intended procedure confirmed with present staff. Received instructions for my participation in the procedure from the performing physician.  

## 2020-09-13 NOTE — Progress Notes (Signed)
VS by CW  Pt's states no medical or surgical changes since previsit or office visit.  
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
## 2020-09-15 ENCOUNTER — Telehealth: Payer: Self-pay

## 2020-09-15 ENCOUNTER — Telehealth: Payer: Self-pay | Admitting: *Deleted

## 2020-09-15 ENCOUNTER — Other Ambulatory Visit: Payer: Self-pay | Admitting: Internal Medicine

## 2020-09-15 ENCOUNTER — Other Ambulatory Visit (HOSPITAL_COMMUNITY): Payer: Self-pay

## 2020-09-15 MED ORDER — ATORVASTATIN CALCIUM 20 MG PO TABS
ORAL_TABLET | Freq: Every day | ORAL | 1 refills | Status: DC
  Filled 2020-09-15: qty 90, 90d supply, fill #0
  Filled 2020-12-20: qty 90, 90d supply, fill #1

## 2020-09-15 NOTE — Telephone Encounter (Signed)
NO ANSWER, MESSAGE LEFT FOR PATIENT. 

## 2020-09-15 NOTE — Telephone Encounter (Signed)
  Follow up Call-  Call back number 09/13/2020  Post procedure Call Back phone  # [phone]  Permission to leave phone message Yes  Some recent data might be hidden     No answer at 2nd attempt follow up phone call.  Left message on voicemail.

## 2020-09-20 ENCOUNTER — Encounter: Payer: Self-pay | Admitting: Gastroenterology

## 2020-09-30 ENCOUNTER — Other Ambulatory Visit (HOSPITAL_COMMUNITY): Payer: Self-pay

## 2020-09-30 ENCOUNTER — Other Ambulatory Visit: Payer: Self-pay | Admitting: Internal Medicine

## 2020-09-30 MED ORDER — HYDROCHLOROTHIAZIDE 25 MG PO TABS
ORAL_TABLET | ORAL | 1 refills | Status: DC
  Filled 2020-09-30: qty 45, 90d supply, fill #0
  Filled 2020-12-30: qty 45, 90d supply, fill #1

## 2020-09-30 MED ORDER — CARVEDILOL 6.25 MG PO TABS
ORAL_TABLET | Freq: Two times a day (BID) | ORAL | 1 refills | Status: DC
  Filled 2020-09-30: qty 180, 90d supply, fill #0
  Filled 2020-12-30: qty 180, 90d supply, fill #1

## 2020-10-01 ENCOUNTER — Ambulatory Visit: Payer: 59 | Admitting: Internal Medicine

## 2020-10-07 ENCOUNTER — Encounter: Payer: Self-pay | Admitting: Internal Medicine

## 2020-10-07 ENCOUNTER — Ambulatory Visit: Payer: 59 | Admitting: Internal Medicine

## 2020-10-07 ENCOUNTER — Other Ambulatory Visit: Payer: Self-pay

## 2020-10-07 VITALS — BP 126/98 | HR 72 | Temp 98.3°F | Resp 16 | Ht 66.0 in | Wt 198.5 lb

## 2020-10-07 DIAGNOSIS — E119 Type 2 diabetes mellitus without complications: Secondary | ICD-10-CM | POA: Diagnosis not present

## 2020-10-07 DIAGNOSIS — E785 Hyperlipidemia, unspecified: Secondary | ICD-10-CM | POA: Diagnosis not present

## 2020-10-07 LAB — LIPID PANEL
Cholesterol: 161 mg/dL (ref 0–200)
HDL: 51.5 mg/dL (ref >39.00)
LDL Cholesterol: 99 mg/dL (ref 0–99)
NonHDL: 109.7
Total CHOL/HDL Ratio: 3
Triglycerides: 55 mg/dL (ref 0.0–149.0)
VLDL: 11 mg/dL (ref 0.0–40.0)

## 2020-10-07 LAB — HEMOGLOBIN A1C: Hgb A1c MFr Bld: 6.9 % — ABNORMAL HIGH (ref 4.6–6.5)

## 2020-10-07 NOTE — Assessment & Plan Note (Signed)
DM: Ambulatory CBGs in the 120s, on metformin, Actos, Januvia.  Diet okay most of the time, he remains very active.  Check A1c.  Adjust meds if needed HTN: BP today [id number], diastolic BP slightly elevated, unable to check ambulatory BPs.  For now continue amlodipine, carvedilol, HCTZ.  Last BMP okay. High cholesterol, on Lipitor 20, check FLP. Preventive care: Rec COVID-vaccine and flu shot RTC 6 months
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
## 2020-10-07 NOTE — Patient Instructions (Addendum)
Recommend to proceed with the following vaccines at your pharmacy:  Covid #4 Flu shot this fall  GO TO THE LAB : Get the blood work     [place], [place] back for  a check up in 6 months

## 2020-10-11 ENCOUNTER — Other Ambulatory Visit: Payer: Self-pay | Admitting: Internal Medicine

## 2020-10-11 ENCOUNTER — Other Ambulatory Visit (HOSPITAL_COMMUNITY): Payer: Self-pay

## 2020-10-11 MED ORDER — SITAGLIPTIN PHOSPHATE 100 MG PO TABS
ORAL_TABLET | Freq: Every day | ORAL | 1 refills | Status: DC
  Filled 2020-10-11: qty 30, 30d supply, fill #0
  Filled 2020-11-15: qty 30, 30d supply, fill #1
  Filled 2020-12-20: qty 30, 30d supply, fill #2
  Filled 2021-01-24: qty 30, 30d supply, fill #3
  Filled 2021-02-25: qty 30, 30d supply, fill #4
  Filled 2021-03-30: qty 30, 30d supply, fill #5

## 2020-10-11 MED ORDER — METFORMIN HCL 850 MG PO TABS
850.0000 mg | ORAL_TABLET | Freq: Three times a day (TID) | ORAL | 1 refills | Status: DC
  Filled 2020-10-11: qty 270, 90d supply, fill #0
  Filled 2021-04-18: qty 270, 90d supply, fill #1

## 2020-10-11 NOTE — Addendum Note (Signed)
Addended byDamita Dunnings D on: 10/11/2020 08:15 AM   Modules accepted: Orders

## 2020-11-15 ENCOUNTER — Other Ambulatory Visit (HOSPITAL_COMMUNITY): Payer: Self-pay

## 2020-11-15 ENCOUNTER — Other Ambulatory Visit: Payer: Self-pay | Admitting: Internal Medicine

## 2020-11-15 MED ORDER — SILDENAFIL CITRATE 100 MG PO TABS
100.0000 mg | ORAL_TABLET | ORAL | 5 refills | Status: DC | PRN
  Filled 2020-11-15: qty 6, 30d supply, fill #0
  Filled 2021-10-31: qty 6, 30d supply, fill #1

## 2020-12-13 ENCOUNTER — Other Ambulatory Visit (HOSPITAL_COMMUNITY): Payer: Self-pay

## 2020-12-13 ENCOUNTER — Other Ambulatory Visit: Payer: Self-pay | Admitting: Internal Medicine

## 2020-12-13 MED ORDER — AMLODIPINE BESYLATE 5 MG PO TABS
5.0000 mg | ORAL_TABLET | Freq: Every day | ORAL | 1 refills | Status: DC
  Filled 2020-12-13: qty 90, 90d supply, fill #0
  Filled 2021-03-16: qty 90, 90d supply, fill #1

## 2020-12-13 MED ORDER — PIOGLITAZONE HCL 30 MG PO TABS
ORAL_TABLET | Freq: Every day | ORAL | 1 refills | Status: DC
  Filled 2020-12-13: qty 90, 90d supply, fill #0
  Filled 2021-03-16: qty 90, 90d supply, fill #1

## 2020-12-20 ENCOUNTER — Other Ambulatory Visit (HOSPITAL_COMMUNITY): Payer: Self-pay

## 2020-12-30 ENCOUNTER — Other Ambulatory Visit (HOSPITAL_COMMUNITY): Payer: Self-pay

## 2021-01-24 ENCOUNTER — Other Ambulatory Visit (HOSPITAL_COMMUNITY): Payer: Self-pay

## 2021-02-25 ENCOUNTER — Other Ambulatory Visit (HOSPITAL_COMMUNITY): Payer: Self-pay

## 2021-03-16 ENCOUNTER — Other Ambulatory Visit (HOSPITAL_COMMUNITY): Payer: Self-pay

## 2021-03-23 ENCOUNTER — Other Ambulatory Visit: Payer: Self-pay | Admitting: Internal Medicine

## 2021-03-23 ENCOUNTER — Other Ambulatory Visit (HOSPITAL_COMMUNITY): Payer: Self-pay

## 2021-03-23 MED ORDER — ATORVASTATIN CALCIUM 20 MG PO TABS
ORAL_TABLET | Freq: Every day | ORAL | 0 refills | Status: DC
  Filled 2021-03-23: qty 30, 30d supply, fill #0

## 2021-03-24 ENCOUNTER — Other Ambulatory Visit (HOSPITAL_COMMUNITY): Payer: Self-pay

## 2021-03-30 ENCOUNTER — Other Ambulatory Visit (HOSPITAL_COMMUNITY): Payer: Self-pay

## 2021-03-30 ENCOUNTER — Other Ambulatory Visit: Payer: Self-pay | Admitting: Internal Medicine

## 2021-03-30 MED ORDER — HYDROCHLOROTHIAZIDE 25 MG PO TABS
12.5000 mg | ORAL_TABLET | Freq: Every day | ORAL | 0 refills | Status: DC
  Filled 2021-03-30: qty 45, 90d supply, fill #0

## 2021-03-30 MED ORDER — CARVEDILOL 6.25 MG PO TABS
6.2500 mg | ORAL_TABLET | Freq: Two times a day (BID) | ORAL | 0 refills | Status: DC
  Filled 2021-03-30: qty 180, 90d supply, fill #0

## 2021-04-18 ENCOUNTER — Other Ambulatory Visit (HOSPITAL_COMMUNITY): Payer: Self-pay

## 2021-04-19 ENCOUNTER — Other Ambulatory Visit (HOSPITAL_COMMUNITY): Payer: Self-pay

## 2021-04-26 ENCOUNTER — Other Ambulatory Visit: Payer: Self-pay | Admitting: Internal Medicine

## 2021-04-26 ENCOUNTER — Other Ambulatory Visit (HOSPITAL_COMMUNITY): Payer: Self-pay

## 2021-04-26 MED ORDER — ATORVASTATIN CALCIUM 20 MG PO TABS
ORAL_TABLET | Freq: Every day | ORAL | 0 refills | Status: DC
  Filled 2021-04-26: qty 30, 30d supply, fill #0

## 2021-04-26 MED ORDER — SITAGLIPTIN PHOSPHATE 100 MG PO TABS
ORAL_TABLET | Freq: Every day | ORAL | 0 refills | Status: DC
Start: 2021-04-26 — End: 2021-05-30
  Filled 2021-04-26: qty 30, 30d supply, fill #0

## 2021-05-25 ENCOUNTER — Telehealth: Payer: Self-pay | Admitting: Internal Medicine

## 2021-05-25 ENCOUNTER — Other Ambulatory Visit (HOSPITAL_COMMUNITY): Payer: Self-pay

## 2021-05-25 MED ORDER — ATORVASTATIN CALCIUM 20 MG PO TABS
ORAL_TABLET | Freq: Every day | ORAL | 0 refills | Status: DC
Start: 2021-05-25 — End: 2021-06-27
  Filled 2021-05-25: qty 30, 30d supply, fill #0

## 2021-05-25 NOTE — Telephone Encounter (Signed)
Will you contact Pt to let him know he is overdue for 6 month follow-up. Thank you.  ?

## 2021-05-26 NOTE — Telephone Encounter (Signed)
Pt called back, appt made for 04/26. ?

## 2021-05-26 NOTE — Telephone Encounter (Signed)
Spoke with pt and informed that he needed to make a fu appt/ call disconnected called twice and no answer, had to LVM  for pt to call to schedule 6 month fu appt/ pt is due.  ?

## 2021-05-30 ENCOUNTER — Other Ambulatory Visit (HOSPITAL_COMMUNITY): Payer: Self-pay

## 2021-05-30 ENCOUNTER — Other Ambulatory Visit: Payer: Self-pay | Admitting: Internal Medicine

## 2021-05-30 MED ORDER — SITAGLIPTIN PHOSPHATE 100 MG PO TABS
ORAL_TABLET | Freq: Every day | ORAL | 0 refills | Status: DC
  Filled 2021-05-30: qty 30, 30d supply, fill #0

## 2021-06-01 ENCOUNTER — Encounter: Payer: Self-pay | Admitting: Internal Medicine

## 2021-06-01 ENCOUNTER — Other Ambulatory Visit (HOSPITAL_COMMUNITY): Payer: Self-pay

## 2021-06-01 ENCOUNTER — Ambulatory Visit: Payer: 59 | Admitting: Internal Medicine

## 2021-06-01 VITALS — BP 126/82 | HR 72 | Temp 98.4°F | Resp 16 | Ht 66.0 in | Wt 201.5 lb

## 2021-06-01 DIAGNOSIS — E785 Hyperlipidemia, unspecified: Secondary | ICD-10-CM

## 2021-06-01 DIAGNOSIS — E119 Type 2 diabetes mellitus without complications: Secondary | ICD-10-CM

## 2021-06-01 DIAGNOSIS — I10 Essential (primary) hypertension: Secondary | ICD-10-CM

## 2021-06-01 LAB — COMPREHENSIVE METABOLIC PANEL
ALT: 33 U/L (ref 0–53)
AST: 19 U/L (ref 0–37)
Albumin: 4.9 g/dL (ref 3.5–5.2)
Alkaline Phosphatase: 37 U/L — ABNORMAL LOW (ref 39–117)
BUN: 15 mg/dL (ref 6–23)
CO2: 31 mEq/L (ref 19–32)
Calcium: 10.1 mg/dL (ref 8.4–10.5)
Chloride: 99 mEq/L (ref 96–112)
Creatinine, Ser: 0.75 mg/dL (ref 0.40–1.50)
GFR: 98.91 mL/min (ref >60.00)
Glucose, Bld: 82 mg/dL (ref 70–99)
Potassium: 4.1 mEq/L (ref 3.5–5.1)
Sodium: 140 mEq/L (ref 135–145)
Total Bilirubin: 0.5 mg/dL (ref 0.2–1.2)
Total Protein: 7.8 g/dL (ref 6.0–8.3)

## 2021-06-01 LAB — CBC WITH DIFFERENTIAL/PLATELET
Basophils Absolute: 0 10*3/uL (ref 0.0–0.1)
Basophils Relative: 0.6 % (ref 0.0–3.0)
Eosinophils Absolute: 0.1 10*3/uL (ref 0.0–0.7)
Eosinophils Relative: 2.1 % (ref 0.0–5.0)
HCT: 42.5 % (ref 39.0–52.0)
Hemoglobin: 14.3 g/dL (ref 13.0–17.0)
Lymphocytes Relative: 30.3 % (ref 12.0–46.0)
Lymphs Abs: 1.8 10*3/uL (ref 0.7–4.0)
MCHC: 33.6 g/dL (ref 30.0–36.0)
MCV: 90.9 fl (ref 78.0–100.0)
Monocytes Absolute: 0.8 10*3/uL (ref 0.1–1.0)
Monocytes Relative: 12.9 % — ABNORMAL HIGH (ref 3.0–12.0)
Neutro Abs: 3.2 10*3/uL (ref 1.4–7.7)
Neutrophils Relative %: 54.1 % (ref 43.0–77.0)
Platelets: 329 10*3/uL (ref 150.0–400.0)
RBC: 4.67 Mil/uL (ref 4.22–5.81)
RDW: 14.1 % (ref 11.5–15.5)
WBC: 5.9 10*3/uL (ref 4.0–10.5)

## 2021-06-01 LAB — MICROALBUMIN / CREATININE URINE RATIO
Creatinine,U: 95.2 mg/dL
Microalb Creat Ratio: 0.9 mg/g (ref 0.0–30.0)
Microalb, Ur: 0.9 mg/dL (ref 0.0–1.9)

## 2021-06-01 LAB — HEMOGLOBIN A1C: Hgb A1c MFr Bld: 6.9 % — ABNORMAL HIGH (ref 4.6–6.5)

## 2021-06-01 MED ORDER — HYDROCORTISONE 2.5 % EX CREA
TOPICAL_CREAM | Freq: Two times a day (BID) | CUTANEOUS | 1 refills | Status: DC
  Filled 2021-06-01: qty 30, 10d supply, fill #0
  Filled 2021-12-26: qty 30, 10d supply, fill #1
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
## 2021-06-01 NOTE — Patient Instructions (Addendum)
Check the  blood pressure regularly ?BP GOAL is between 110/65 and  135/85. ?If it is consistently higher or lower, let me know ? ?Use the cream twice a day as needed for any rash ? ? ?GO TO THE LAB : Get the blood work   ? ? ?[place], [place] ?Come back for a physical exam in 4 months ?

## 2021-06-02 NOTE — Assessment & Plan Note (Signed)
DM: Last A1c 6.9, metformin increased to 3 times daily, reports good compliance, ambulatory CBGs in the 120s.  Also on Januvia and Actos. ?Foot exam negative. ?Plan: Check A1c and micro ?HTN: BP today is very good, no ambulatory BPs, encouraged to check.  Continue amlodipine, carvedilol, check CMP and CBC ?Rash: New issue, as described above, etiology unclear, no obvious eczema.  Plan: Trial with hydrocortisone 2.5% as needed.  Call if worse. ?Hyperlipidemia: Last LDL satisfactory. ?Preventive care: Had a colonoscopy  09-2020. ?RTC CPX 4 months ?

## 2021-06-08 ENCOUNTER — Ambulatory Visit: Payer: 59 | Admitting: Internal Medicine

## 2021-06-15 ENCOUNTER — Other Ambulatory Visit (HOSPITAL_COMMUNITY): Payer: Self-pay

## 2021-06-15 ENCOUNTER — Telehealth: Payer: Self-pay | Admitting: Internal Medicine

## 2021-06-15 MED ORDER — PIOGLITAZONE HCL 30 MG PO TABS
ORAL_TABLET | Freq: Every day | ORAL | 1 refills | Status: DC
  Filled 2021-06-15: qty 90, 90d supply, fill #0
  Filled 2021-09-15: qty 90, 90d supply, fill #1

## 2021-06-15 MED ORDER — AMLODIPINE BESYLATE 5 MG PO TABS
5.0000 mg | ORAL_TABLET | Freq: Every day | ORAL | 1 refills | Status: DC
  Filled 2021-06-15: qty 90, 90d supply, fill #0
  Filled 2021-09-19: qty 90, 90d supply, fill #1

## 2021-06-15 NOTE — Telephone Encounter (Signed)
Pt did not stop and schedule CPX at visit on 06/01/21- he needs a CPX in 4 months- can you try reaching him to schedule that please?  ?

## 2021-06-16 NOTE — Telephone Encounter (Signed)
Called pt - scheduled appt for 10-03-2021. Done ?

## 2021-06-27 ENCOUNTER — Other Ambulatory Visit (HOSPITAL_COMMUNITY): Payer: Self-pay

## 2021-06-27 ENCOUNTER — Other Ambulatory Visit: Payer: Self-pay | Admitting: Internal Medicine

## 2021-06-27 MED ORDER — SITAGLIPTIN PHOSPHATE 100 MG PO TABS
100.0000 mg | ORAL_TABLET | Freq: Every day | ORAL | 1 refills | Status: DC
Start: 2021-06-27 — End: 2021-12-29
  Filled 2021-06-27: qty 90, 90d supply, fill #0
  Filled 2021-09-29: qty 30, 30d supply, fill #1
  Filled 2021-10-31: qty 30, 30d supply, fill #2
  Filled 2021-11-30: qty 30, 30d supply, fill #3

## 2021-06-27 MED ORDER — ATORVASTATIN CALCIUM 20 MG PO TABS
20.0000 mg | ORAL_TABLET | Freq: Every day | ORAL | 1 refills | Status: DC
  Filled 2021-06-27: qty 90, 90d supply, fill #0
  Filled 2021-09-29: qty 90, 90d supply, fill #1

## 2021-06-27 MED ORDER — HYDROCHLOROTHIAZIDE 25 MG PO TABS
12.5000 mg | ORAL_TABLET | Freq: Every day | ORAL | 1 refills | Status: DC
  Filled 2021-06-27: qty 45, 90d supply, fill #0
  Filled 2021-09-19: qty 45, 90d supply, fill #1

## 2021-06-30 ENCOUNTER — Other Ambulatory Visit (HOSPITAL_COMMUNITY): Payer: Self-pay

## 2021-06-30 ENCOUNTER — Other Ambulatory Visit: Payer: Self-pay | Admitting: Internal Medicine

## 2021-06-30 MED ORDER — CARVEDILOL 6.25 MG PO TABS
6.2500 mg | ORAL_TABLET | Freq: Two times a day (BID) | ORAL | 0 refills | Status: DC
Start: 2021-06-30 — End: 2021-09-29
  Filled 2021-06-30: qty 180, 90d supply, fill #0

## 2021-07-19 ENCOUNTER — Encounter: Payer: Self-pay | Admitting: Internal Medicine

## 2021-07-19 DIAGNOSIS — H524 Presbyopia: Secondary | ICD-10-CM | POA: Diagnosis not present

## 2021-07-19 DIAGNOSIS — H25813 Combined forms of age-related cataract, bilateral: Secondary | ICD-10-CM | POA: Diagnosis not present

## 2021-07-19 DIAGNOSIS — E1136 Type 2 diabetes mellitus with diabetic cataract: Secondary | ICD-10-CM | POA: Diagnosis not present

## 2021-07-19 DIAGNOSIS — H5203 Hypermetropia, bilateral: Secondary | ICD-10-CM | POA: Diagnosis not present

## 2021-07-19 DIAGNOSIS — H52203 Unspecified astigmatism, bilateral: Secondary | ICD-10-CM | POA: Diagnosis not present

## 2021-07-19 LAB — HM DIABETES EYE EXAM

## 2021-09-01 ENCOUNTER — Other Ambulatory Visit (HOSPITAL_COMMUNITY): Payer: Self-pay

## 2021-09-01 ENCOUNTER — Other Ambulatory Visit: Payer: Self-pay | Admitting: Internal Medicine

## 2021-09-01 MED ORDER — METFORMIN HCL 850 MG PO TABS
850.0000 mg | ORAL_TABLET | Freq: Three times a day (TID) | ORAL | 1 refills | Status: DC
  Filled 2021-09-01: qty 270, 90d supply, fill #0
  Filled 2021-12-29: qty 270, 90d supply, fill #1

## 2021-09-02 ENCOUNTER — Other Ambulatory Visit (HOSPITAL_COMMUNITY): Payer: Self-pay

## 2021-09-15 ENCOUNTER — Other Ambulatory Visit (HOSPITAL_COMMUNITY): Payer: Self-pay

## 2021-09-19 ENCOUNTER — Other Ambulatory Visit (HOSPITAL_COMMUNITY): Payer: Self-pay

## 2021-09-29 ENCOUNTER — Other Ambulatory Visit: Payer: Self-pay | Admitting: Internal Medicine

## 2021-09-29 ENCOUNTER — Other Ambulatory Visit (HOSPITAL_COMMUNITY): Payer: Self-pay

## 2021-09-29 MED ORDER — CARVEDILOL 6.25 MG PO TABS
6.2500 mg | ORAL_TABLET | Freq: Two times a day (BID) | ORAL | 1 refills | Status: DC
Start: 2021-09-29 — End: 2022-04-03
  Filled 2021-09-29: qty 180, 90d supply, fill #0
  Filled 2021-12-29: qty 180, 90d supply, fill #1

## 2021-10-31 ENCOUNTER — Other Ambulatory Visit (HOSPITAL_COMMUNITY): Payer: Self-pay

## 2021-11-01 ENCOUNTER — Other Ambulatory Visit (HOSPITAL_COMMUNITY): Payer: Self-pay

## 2021-11-03 ENCOUNTER — Encounter: Payer: 59 | Admitting: Internal Medicine

## 2021-11-30 ENCOUNTER — Other Ambulatory Visit (HOSPITAL_COMMUNITY): Payer: Self-pay

## 2021-12-08 ENCOUNTER — Encounter: Payer: Self-pay | Admitting: Internal Medicine

## 2021-12-08 ENCOUNTER — Ambulatory Visit (INDEPENDENT_AMBULATORY_CARE_PROVIDER_SITE_OTHER): Payer: 59 | Admitting: Internal Medicine

## 2021-12-08 VITALS — BP 126/82 | HR 80 | Temp 98.2°F | Resp 16 | Ht 66.0 in | Wt 200.1 lb

## 2021-12-08 DIAGNOSIS — I10 Essential (primary) hypertension: Secondary | ICD-10-CM

## 2021-12-08 DIAGNOSIS — E785 Hyperlipidemia, unspecified: Secondary | ICD-10-CM | POA: Diagnosis not present

## 2021-12-08 DIAGNOSIS — Z0000 Encounter for general adult medical examination without abnormal findings: Secondary | ICD-10-CM

## 2021-12-08 DIAGNOSIS — E119 Type 2 diabetes mellitus without complications: Secondary | ICD-10-CM

## 2021-12-08 LAB — LIPID PANEL
Cholesterol: 136 mg/dL (ref 0–200)
HDL: 45 mg/dL (ref >39.00)
LDL Cholesterol: 79 mg/dL (ref 0–99)
NonHDL: 90.6
Total CHOL/HDL Ratio: 3
Triglycerides: 58 mg/dL (ref 0.0–149.0)
VLDL: 11.6 mg/dL (ref 0.0–40.0)

## 2021-12-08 LAB — BASIC METABOLIC PANEL
BUN: 14 mg/dL (ref 6–23)
CO2: 31 mEq/L (ref 19–32)
Calcium: 9.8 mg/dL (ref 8.4–10.5)
Chloride: 101 mEq/L (ref 96–112)
Creatinine, Ser: 0.72 mg/dL (ref 0.40–1.50)
GFR: 99.77 mL/min (ref >60.00)
Glucose, Bld: 118 mg/dL — ABNORMAL HIGH (ref 70–99)
Potassium: 4.4 mEq/L (ref 3.5–5.1)
Sodium: 139 mEq/L (ref 135–145)

## 2021-12-08 LAB — HEMOGLOBIN A1C: Hgb A1c MFr Bld: 6.9 % — ABNORMAL HIGH (ref 4.6–6.5)

## 2021-12-08 LAB — TSH: TSH: 0.69 u[IU]/mL (ref 0.35–5.50)
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
## 2021-12-08 NOTE — Patient Instructions (Addendum)
Check the  blood pressure regularly BP GOAL is between 110/65 and  135/85. If it is consistently higher or lower, let me know    GO TO THE LAB : Get the blood work     [place], [place] back for a checkup in 4 to 6 months

## 2021-12-08 NOTE — Assessment & Plan Note (Signed)
--  Tdat 10/2018 - PNM 23:  2015;  prevnar 2017  - s/p shingrix x 2 -COVID VAX- declines  - had a flu shot  --  + FHf heart disease, plan is to control cardiovascular risk factors. On  ASA  -- CCS:  Colonoscopy 09-2015, cscope 09-2020, next per GI. --Prostate cancer screening: DRE- PSA.wnl last year  --Diet and exercise: Very active at work, room for improvement in diet, counseled. -- labs:C BMP FLP A1c TSH

## 2021-12-08 NOTE — Assessment & Plan Note (Signed)
Here for CPX DM: Last A1c 6.9, ideal 6.5.  Ambulatory CBGs when checked in the 120s.  Continue Januvia, metformin, Actos, check A1c.  He is active at work, room for improvement on diet, encouraged to work on it. HTN: BP is very good, on amlodipine, carvedilol, HCTZ.  Checking labs, BP today is good, no ambulatory BPs. High cholesterol: On atorvastatin, checking labs. Eczema: Randon pruritic rashes, on hydrocortisone topical as needed. RTC 4 to 6 months

## 2021-12-12 ENCOUNTER — Other Ambulatory Visit (HOSPITAL_COMMUNITY): Payer: Self-pay

## 2021-12-12 MED ORDER — PIOGLITAZONE HCL 45 MG PO TABS
45.0000 mg | ORAL_TABLET | Freq: Every day | ORAL | 1 refills | Status: DC
  Filled 2021-12-12: qty 90, 90d supply, fill #0
  Filled 2022-03-23: qty 90, 90d supply, fill #1

## 2021-12-12 NOTE — Addendum Note (Signed)
Addended by: Damita Dunnings D on: 12/12/2021 08:03 AM   Modules accepted: Orders

## 2021-12-22 ENCOUNTER — Other Ambulatory Visit (HOSPITAL_COMMUNITY): Payer: Self-pay

## 2021-12-22 ENCOUNTER — Other Ambulatory Visit: Payer: Self-pay | Admitting: Internal Medicine

## 2021-12-22 MED ORDER — HYDROCHLOROTHIAZIDE 25 MG PO TABS
12.5000 mg | ORAL_TABLET | Freq: Every day | ORAL | 1 refills | Status: DC
  Filled 2021-12-22: qty 45, 90d supply, fill #0
  Filled 2022-03-23: qty 45, 90d supply, fill #1

## 2021-12-22 MED ORDER — ATORVASTATIN CALCIUM 20 MG PO TABS
20.0000 mg | ORAL_TABLET | Freq: Every day | ORAL | 1 refills | Status: DC
  Filled 2021-12-22: qty 90, 90d supply, fill #0
  Filled 2022-03-28: qty 90, 90d supply, fill #1

## 2021-12-26 ENCOUNTER — Other Ambulatory Visit (HOSPITAL_COMMUNITY): Payer: Self-pay

## 2021-12-29 ENCOUNTER — Other Ambulatory Visit (HOSPITAL_COMMUNITY): Payer: Self-pay

## 2021-12-29 ENCOUNTER — Other Ambulatory Visit: Payer: Self-pay | Admitting: Internal Medicine

## 2021-12-29 MED ORDER — SITAGLIPTIN PHOSPHATE 100 MG PO TABS
100.0000 mg | ORAL_TABLET | Freq: Every day | ORAL | 1 refills | Status: DC
  Filled 2021-12-29: qty 30, 30d supply, fill #0
  Filled 2022-02-02 (×2): qty 30, 30d supply, fill #1
  Filled 2022-03-02: qty 30, 30d supply, fill #2
  Filled 2022-04-03: qty 30, 30d supply, fill #3
  Filled 2022-05-01: qty 30, 30d supply, fill #4
  Filled 2022-06-02: qty 30, 30d supply, fill #5

## 2021-12-30 ENCOUNTER — Other Ambulatory Visit (HOSPITAL_COMMUNITY): Payer: Self-pay

## 2022-01-04 ENCOUNTER — Other Ambulatory Visit (HOSPITAL_COMMUNITY): Payer: Self-pay

## 2022-02-02 ENCOUNTER — Other Ambulatory Visit (HOSPITAL_COMMUNITY): Payer: Self-pay

## 2022-02-02 ENCOUNTER — Other Ambulatory Visit: Payer: Self-pay | Admitting: Internal Medicine

## 2022-02-02 ENCOUNTER — Other Ambulatory Visit: Payer: Self-pay

## 2022-02-02 MED ORDER — AMLODIPINE BESYLATE 5 MG PO TABS
5.0000 mg | ORAL_TABLET | Freq: Every day | ORAL | 1 refills | Status: DC
  Filled 2022-02-02: qty 90, 90d supply, fill #0
  Filled 2022-05-01: qty 90, 90d supply, fill #1

## 2022-03-02 ENCOUNTER — Other Ambulatory Visit (HOSPITAL_COMMUNITY): Payer: Self-pay

## 2022-03-23 ENCOUNTER — Other Ambulatory Visit (HOSPITAL_COMMUNITY): Payer: Self-pay

## 2022-03-28 ENCOUNTER — Other Ambulatory Visit (HOSPITAL_COMMUNITY): Payer: Self-pay

## 2022-03-29 ENCOUNTER — Other Ambulatory Visit (HOSPITAL_COMMUNITY): Payer: Self-pay

## 2022-04-03 ENCOUNTER — Other Ambulatory Visit: Payer: Self-pay | Admitting: Internal Medicine

## 2022-04-03 ENCOUNTER — Other Ambulatory Visit (HOSPITAL_COMMUNITY): Payer: Self-pay

## 2022-04-03 MED ORDER — CARVEDILOL 6.25 MG PO TABS
6.2500 mg | ORAL_TABLET | Freq: Two times a day (BID) | ORAL | 0 refills | Status: DC
  Filled 2022-04-03: qty 180, 90d supply, fill #0

## 2022-04-27 ENCOUNTER — Ambulatory Visit: Payer: Commercial Managed Care - PPO | Admitting: Internal Medicine

## 2022-04-27 ENCOUNTER — Other Ambulatory Visit (HOSPITAL_COMMUNITY): Payer: Self-pay

## 2022-04-27 ENCOUNTER — Encounter: Payer: Self-pay | Admitting: Internal Medicine

## 2022-04-27 VITALS — BP 126/80 | HR 75 | Temp 98.2°F | Resp 16 | Ht 66.0 in | Wt 193.4 lb

## 2022-04-27 DIAGNOSIS — E785 Hyperlipidemia, unspecified: Secondary | ICD-10-CM

## 2022-04-27 DIAGNOSIS — I10 Essential (primary) hypertension: Secondary | ICD-10-CM

## 2022-04-27 DIAGNOSIS — E119 Type 2 diabetes mellitus without complications: Secondary | ICD-10-CM | POA: Diagnosis not present

## 2022-04-27 LAB — ALT: ALT: 20 U/L (ref 0–53)

## 2022-04-27 LAB — BASIC METABOLIC PANEL
BUN: 17 mg/dL (ref 6–23)
CO2: 31 mEq/L (ref 19–32)
Calcium: 10.1 mg/dL (ref 8.4–10.5)
Chloride: 104 mEq/L (ref 96–112)
Creatinine, Ser: 0.74 mg/dL (ref 0.40–1.50)
GFR: 98.68 mL/min (ref >60.00)
Glucose, Bld: 103 mg/dL — ABNORMAL HIGH (ref 70–99)
Potassium: 4.5 mEq/L (ref 3.5–5.1)
Sodium: 143 mEq/L (ref 135–145)

## 2022-04-27 LAB — MICROALBUMIN / CREATININE URINE RATIO
Creatinine,U: 175.2 mg/dL
Microalb Creat Ratio: 1 mg/g (ref 0.0–30.0)
Microalb, Ur: 1.8 mg/dL (ref 0.0–1.9)

## 2022-04-27 LAB — AST: AST: 17 U/L (ref 0–37)

## 2022-04-27 LAB — HEMOGLOBIN A1C: Hgb A1c MFr Bld: 6.7 % — ABNORMAL HIGH (ref 4.6–6.5)

## 2022-04-27 MED ORDER — HYDROCORTISONE 2.5 % EX CREA
TOPICAL_CREAM | Freq: Two times a day (BID) | CUTANEOUS | 1 refills | Status: DC
  Filled 2022-04-27: qty 30, 15d supply, fill #0
  Filled 2022-08-04: qty 30, 15d supply, fill #1

## 2022-04-27 NOTE — Patient Instructions (Addendum)
Vaccines I recommend:  Covid booster RSV vaccine     GO TO THE LAB : Get the blood work     [place], PLEASE SCHEDULE YOUR APPOINTMENTS Come back for a physical exam by October 2024
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
## 2022-04-27 NOTE — Assessment & Plan Note (Signed)
DM: On Januvia, metformin and Actos, based on last A1c Actos increased to 45 mg.  Good compliance and tolerance.  Ambulatory CBGs in the 120s, occasionally under 100.  Check labs. HTN: BP is very good, on HCTZ, carvedilol, amlodipine.  Check BMP. Hyperlipidemia: Well-controlled on Lipitor RTC 11-2022 CPX

## 2022-05-01 ENCOUNTER — Other Ambulatory Visit (HOSPITAL_COMMUNITY): Payer: Self-pay

## 2022-05-10 ENCOUNTER — Ambulatory Visit: Payer: 59 | Admitting: Internal Medicine

## 2022-05-26 ENCOUNTER — Encounter: Payer: Self-pay | Admitting: Internal Medicine

## 2022-06-02 ENCOUNTER — Other Ambulatory Visit: Payer: Self-pay | Admitting: Internal Medicine

## 2022-06-02 ENCOUNTER — Other Ambulatory Visit (HOSPITAL_COMMUNITY): Payer: Self-pay

## 2022-06-02 MED ORDER — METFORMIN HCL 850 MG PO TABS
850.0000 mg | ORAL_TABLET | Freq: Three times a day (TID) | ORAL | 1 refills | Status: DC
  Filled 2022-06-02: qty 270, 90d supply, fill #0
  Filled 2022-10-30: qty 270, 90d supply, fill #1

## 2022-06-23 ENCOUNTER — Other Ambulatory Visit (HOSPITAL_COMMUNITY): Payer: Self-pay

## 2022-06-23 ENCOUNTER — Other Ambulatory Visit: Payer: Self-pay | Admitting: Internal Medicine

## 2022-06-23 MED ORDER — HYDROCHLOROTHIAZIDE 25 MG PO TABS
12.5000 mg | ORAL_TABLET | Freq: Every day | ORAL | 1 refills | Status: DC
  Filled 2022-06-23: qty 45, 90d supply, fill #0
  Filled 2022-09-25: qty 45, 90d supply, fill #1

## 2022-06-26 ENCOUNTER — Other Ambulatory Visit (HOSPITAL_COMMUNITY): Payer: Self-pay

## 2022-06-26 ENCOUNTER — Other Ambulatory Visit: Payer: Self-pay | Admitting: Internal Medicine

## 2022-06-26 MED ORDER — SITAGLIPTIN PHOSPHATE 100 MG PO TABS
100.0000 mg | ORAL_TABLET | Freq: Every day | ORAL | 1 refills | Status: DC
  Filled 2022-06-26: qty 30, 30d supply, fill #0
  Filled 2022-08-01: qty 30, 30d supply, fill #1
  Filled 2022-08-31: qty 30, 30d supply, fill #2
  Filled 2022-10-02: qty 30, 30d supply, fill #3
  Filled 2022-11-06: qty 30, 30d supply, fill #4
  Filled 2022-12-14: qty 30, 30d supply, fill #5

## 2022-06-26 MED ORDER — PIOGLITAZONE HCL 45 MG PO TABS
45.0000 mg | ORAL_TABLET | Freq: Every day | ORAL | 1 refills | Status: DC
  Filled 2022-06-26: qty 90, 90d supply, fill #0
  Filled 2022-09-25: qty 90, 90d supply, fill #1

## 2022-06-26 MED ORDER — ATORVASTATIN CALCIUM 20 MG PO TABS
20.0000 mg | ORAL_TABLET | Freq: Every day | ORAL | 1 refills | Status: DC
  Filled 2022-06-26: qty 90, 90d supply, fill #0
  Filled 2022-09-25: qty 90, 90d supply, fill #1

## 2022-07-05 ENCOUNTER — Other Ambulatory Visit (HOSPITAL_COMMUNITY): Payer: Self-pay

## 2022-07-05 ENCOUNTER — Other Ambulatory Visit: Payer: Self-pay | Admitting: Internal Medicine

## 2022-07-05 MED ORDER — CARVEDILOL 6.25 MG PO TABS
6.2500 mg | ORAL_TABLET | Freq: Two times a day (BID) | ORAL | 1 refills | Status: DC
  Filled 2022-07-05: qty 180, 90d supply, fill #0
  Filled 2022-10-02: qty 180, 90d supply, fill #1

## 2022-08-01 ENCOUNTER — Other Ambulatory Visit: Payer: Self-pay | Admitting: Internal Medicine

## 2022-08-01 ENCOUNTER — Other Ambulatory Visit (HOSPITAL_COMMUNITY): Payer: Self-pay

## 2022-08-01 MED ORDER — AMLODIPINE BESYLATE 5 MG PO TABS
5.0000 mg | ORAL_TABLET | Freq: Every day | ORAL | 1 refills | Status: DC
  Filled 2022-08-01: qty 90, 90d supply, fill #0
  Filled 2022-10-30: qty 90, 90d supply, fill #1

## 2022-08-04 ENCOUNTER — Other Ambulatory Visit (HOSPITAL_COMMUNITY): Payer: Self-pay

## 2022-08-31 ENCOUNTER — Other Ambulatory Visit (HOSPITAL_COMMUNITY): Payer: Self-pay

## 2022-09-25 ENCOUNTER — Other Ambulatory Visit (HOSPITAL_COMMUNITY): Payer: Self-pay

## 2022-09-25 ENCOUNTER — Other Ambulatory Visit: Payer: Self-pay

## 2022-10-02 ENCOUNTER — Other Ambulatory Visit (HOSPITAL_COMMUNITY): Payer: Self-pay

## 2022-10-30 ENCOUNTER — Other Ambulatory Visit (HOSPITAL_COMMUNITY): Payer: Self-pay

## 2022-10-31 ENCOUNTER — Other Ambulatory Visit (HOSPITAL_COMMUNITY): Payer: Self-pay

## 2022-11-01 ENCOUNTER — Other Ambulatory Visit (HOSPITAL_COMMUNITY): Payer: Self-pay

## 2022-11-06 ENCOUNTER — Other Ambulatory Visit (HOSPITAL_COMMUNITY): Payer: Self-pay

## 2022-12-14 ENCOUNTER — Other Ambulatory Visit (HOSPITAL_COMMUNITY): Payer: Self-pay

## 2022-12-19 ENCOUNTER — Ambulatory Visit: Payer: Commercial Managed Care - PPO | Admitting: Internal Medicine

## 2022-12-19 ENCOUNTER — Encounter: Payer: Self-pay | Admitting: Internal Medicine

## 2022-12-19 VITALS — BP 126/84 | HR 85 | Temp 98.1°F | Resp 16 | Ht 66.0 in | Wt 204.2 lb

## 2022-12-19 DIAGNOSIS — I10 Essential (primary) hypertension: Secondary | ICD-10-CM

## 2022-12-19 DIAGNOSIS — Z0001 Encounter for general adult medical examination with abnormal findings: Secondary | ICD-10-CM

## 2022-12-19 DIAGNOSIS — Z0000 Encounter for general adult medical examination without abnormal findings: Secondary | ICD-10-CM | POA: Diagnosis not present

## 2022-12-19 DIAGNOSIS — E785 Hyperlipidemia, unspecified: Secondary | ICD-10-CM

## 2022-12-19 DIAGNOSIS — E119 Type 2 diabetes mellitus without complications: Secondary | ICD-10-CM | POA: Diagnosis not present

## 2022-12-19 DIAGNOSIS — Z7984 Long term (current) use of oral hypoglycemic drugs: Secondary | ICD-10-CM

## 2022-12-19 DIAGNOSIS — J40 Bronchitis, not specified as acute or chronic: Secondary | ICD-10-CM | POA: Diagnosis not present

## 2022-12-19 LAB — PSA: PSA: 1.38 ng/mL (ref 0.10–4.00)

## 2022-12-19 LAB — COMPREHENSIVE METABOLIC PANEL
ALT: 21 U/L (ref 0–53)
AST: 13 U/L (ref 0–37)
Albumin: 4.4 g/dL (ref 3.5–5.2)
Alkaline Phosphatase: 47 U/L (ref 39–117)
BUN: 12 mg/dL (ref 6–23)
CO2: 32 meq/L (ref 19–32)
Calcium: 9.8 mg/dL (ref 8.4–10.5)
Chloride: 100 meq/L (ref 96–112)
Creatinine, Ser: 0.7 mg/dL (ref 0.40–1.50)
GFR: 99.9 mL/min (ref >60.00)
Glucose, Bld: 127 mg/dL — ABNORMAL HIGH (ref 70–99)
Potassium: 4.1 meq/L (ref 3.5–5.1)
Sodium: 141 meq/L (ref 135–145)
Total Bilirubin: 0.4 mg/dL (ref 0.2–1.2)
Total Protein: 7.6 g/dL (ref 6.0–8.3)

## 2022-12-19 LAB — LIPID PANEL
Cholesterol: 132 mg/dL (ref 0–200)
HDL: 44.1 mg/dL (ref >39.00)
LDL Cholesterol: 76 mg/dL (ref 0–99)
NonHDL: 87.5
Total CHOL/HDL Ratio: 3
Triglycerides: 57 mg/dL (ref 0.0–149.0)
VLDL: 11.4 mg/dL (ref 0.0–40.0)

## 2022-12-19 LAB — CBC WITH DIFFERENTIAL/PLATELET
Basophils Absolute: 0 10*3/uL (ref 0.0–0.1)
Basophils Relative: 0.4 % (ref 0.0–3.0)
Eosinophils Absolute: 0.2 10*3/uL (ref 0.0–0.7)
Eosinophils Relative: 2.9 % (ref 0.0–5.0)
HCT: 41.2 % (ref 39.0–52.0)
Hemoglobin: 13.5 g/dL (ref 13.0–17.0)
Lymphocytes Relative: 21.8 % (ref 12.0–46.0)
Lymphs Abs: 1.9 10*3/uL (ref 0.7–4.0)
MCHC: 32.8 g/dL (ref 30.0–36.0)
MCV: 92.3 fL (ref 78.0–100.0)
Monocytes Absolute: 1.3 10*3/uL — ABNORMAL HIGH (ref 0.1–1.0)
Monocytes Relative: 14.6 % — ABNORMAL HIGH (ref 3.0–12.0)
Neutro Abs: 5.2 10*3/uL (ref 1.4–7.7)
Neutrophils Relative %: 60.3 % (ref 43.0–77.0)
Platelets: 369 10*3/uL (ref 150.0–400.0)
RBC: 4.46 Mil/uL (ref 4.22–5.81)
RDW: 14 % (ref 11.5–15.5)
WBC: 8.7 10*3/uL (ref 4.0–10.5)

## 2022-12-19 LAB — HEMOGLOBIN A1C: Hgb A1c MFr Bld: 6.5 % (ref 4.6–6.5)

## 2022-12-19 MED ORDER — AZITHROMYCIN 250 MG PO TABS: ORAL_TABLET | ORAL | 0 refills | Status: DC

## 2022-12-19 NOTE — Assessment & Plan Note (Signed)
Here for CPX DM: On metformin, pioglitazone, Januvia.  Ambulatory CBGs in the 120s.  Encouraged to continue with a healthy lifestyle, check labs. HTN: No ambulatory BPs, BP today is very good, continue amlodipine, HCTZ.  Labs. High cholesterol: On atorvastatin 20 mg, check FLP, LFTs.  Further eval results. Bronchitis: Respiratory symptoms for 1 week, onset was shortly after flu shot.  Plan: Rest, fluids, Mucinex.  Z-Pak if not improving in few days.  All this was discussed in [place]. RTC 6 months.

## 2022-12-19 NOTE — Assessment & Plan Note (Signed)
Here for CPX -Tdap 10/2018 - PNM 23:  2015;  prevnar 2017  - s/p shingrix x 2 - had a flu shot last week, c/o respiratory sxs since then - Rec RSV-covid vax  --  + FH heart disease, RX: control CV RF, ASA  -- CCS:  Colonoscopy 09-2015, cscope 09-2020, next per GI. --Prostate cancer screening: Asymptomatic, no FH, check PSA. --Remains active at work, encouraged heart healthy diet. - labs:CMP FLP CBC A1c PSA -Information regards Healthcare power of attorney provided (in [place]).
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
## 2022-12-19 NOTE — Patient Instructions (Addendum)
For bronchitis:  Lots of fluids For cough take Robitussin DM or Mucinex DM over-the-counter You have a prescription for an antibiotic called azithromycin, if you are not gradually better in the next few days okay to start it. If you have severe symptoms let me know.  Vaccines I recommend: Covid booster RSV vaccine  Diabetes: You can check your sugars at different times, they right times to do it are: - early in AM fasting  ( blood sugar goal 70-130) - 2 hours after a meal (blood sugar goal less than 180)    Check the  blood pressure regularly Blood pressure goal:  between 110/65 and  135/85. If it is consistently higher or lower, let me know     GO TO THE LAB : Get the blood work     Next visit with me in 6 months    Please schedule it at the front desk    Aparentemente ya es tiempo de hacerse un examen de los ojos. Por favor llame a su doctor de los ojos (ophthalmologist, optometrist) y saque una cita. Solicite que nos envien una copia de la visita a nuestro fax: [phone]. Si necesitara un "referral" nosotros lo podemos hacer

## 2022-12-21 ENCOUNTER — Encounter: Payer: Self-pay | Admitting: Internal Medicine

## 2022-12-21 ENCOUNTER — Other Ambulatory Visit (HOSPITAL_COMMUNITY): Payer: Self-pay

## 2022-12-21 ENCOUNTER — Other Ambulatory Visit: Payer: Self-pay | Admitting: Internal Medicine

## 2022-12-21 MED ORDER — SILDENAFIL CITRATE 100 MG PO TABS
100.0000 mg | ORAL_TABLET | ORAL | 5 refills | Status: AC | PRN
  Filled 2022-12-21: qty 10, 30d supply, fill #0
  Filled 2023-05-07: qty 10, 30d supply, fill #1
  Filled 2023-10-22: qty 10, 30d supply, fill #2

## 2022-12-21 MED ORDER — HYDROCHLOROTHIAZIDE 25 MG PO TABS
12.5000 mg | ORAL_TABLET | Freq: Every day | ORAL | 1 refills | Status: DC
  Filled 2022-12-21: qty 45, 90d supply, fill #0
  Filled 2023-03-27: qty 45, 90d supply, fill #1

## 2022-12-22 ENCOUNTER — Other Ambulatory Visit (HOSPITAL_COMMUNITY): Payer: Self-pay

## 2022-12-25 ENCOUNTER — Other Ambulatory Visit: Payer: Self-pay | Admitting: Internal Medicine

## 2022-12-25 ENCOUNTER — Other Ambulatory Visit (HOSPITAL_COMMUNITY): Payer: Self-pay

## 2022-12-25 MED ORDER — ATORVASTATIN CALCIUM 20 MG PO TABS
20.0000 mg | ORAL_TABLET | Freq: Every day | ORAL | 1 refills | Status: DC
  Filled 2022-12-25: qty 90, 90d supply, fill #0
  Filled 2023-03-27: qty 90, 90d supply, fill #1

## 2022-12-25 MED ORDER — PIOGLITAZONE HCL 45 MG PO TABS
45.0000 mg | ORAL_TABLET | Freq: Every day | ORAL | 1 refills | Status: DC
  Filled 2022-12-25: qty 90, 90d supply, fill #0
  Filled 2023-03-27 (×2): qty 90, 90d supply, fill #1

## 2022-12-27 ENCOUNTER — Other Ambulatory Visit (HOSPITAL_COMMUNITY): Payer: Self-pay

## 2022-12-29 ENCOUNTER — Other Ambulatory Visit: Payer: Self-pay | Admitting: Internal Medicine

## 2022-12-29 ENCOUNTER — Other Ambulatory Visit (HOSPITAL_COMMUNITY): Payer: Self-pay

## 2022-12-29 MED ORDER — HYDROCORTISONE 2.5 % EX CREA
TOPICAL_CREAM | Freq: Two times a day (BID) | CUTANEOUS | 1 refills | Status: DC
  Filled 2022-12-29: qty 30, 15d supply, fill #0
  Filled 2023-05-14: qty 30, 15d supply, fill #1

## 2023-01-02 ENCOUNTER — Other Ambulatory Visit (HOSPITAL_COMMUNITY): Payer: Self-pay

## 2023-01-02 ENCOUNTER — Other Ambulatory Visit: Payer: Self-pay | Admitting: Internal Medicine

## 2023-01-02 MED ORDER — CARVEDILOL 6.25 MG PO TABS
6.2500 mg | ORAL_TABLET | Freq: Two times a day (BID) | ORAL | 1 refills | Status: DC
  Filled 2023-01-02: qty 180, 90d supply, fill #0
  Filled 2023-04-03: qty 180, 90d supply, fill #1

## 2023-01-12 ENCOUNTER — Other Ambulatory Visit (HOSPITAL_COMMUNITY): Payer: Self-pay

## 2023-01-12 ENCOUNTER — Other Ambulatory Visit: Payer: Self-pay | Admitting: Internal Medicine

## 2023-01-13 ENCOUNTER — Other Ambulatory Visit (HOSPITAL_COMMUNITY): Payer: Self-pay

## 2023-01-13 MED ORDER — SITAGLIPTIN PHOSPHATE 100 MG PO TABS
100.0000 mg | ORAL_TABLET | Freq: Every day | ORAL | 1 refills | Status: DC
  Filled 2023-01-13: qty 90, 90d supply, fill #0
  Filled 2023-04-13: qty 90, 90d supply, fill #1

## 2023-01-15 ENCOUNTER — Other Ambulatory Visit (HOSPITAL_COMMUNITY): Payer: Self-pay

## 2023-02-01 ENCOUNTER — Other Ambulatory Visit: Payer: Self-pay | Admitting: Internal Medicine

## 2023-02-01 ENCOUNTER — Other Ambulatory Visit (HOSPITAL_COMMUNITY): Payer: Self-pay

## 2023-02-01 MED ORDER — AMLODIPINE BESYLATE 5 MG PO TABS
5.0000 mg | ORAL_TABLET | Freq: Every day | ORAL | 1 refills | Status: DC
  Filled 2023-02-01: qty 90, 90d supply, fill #0
  Filled 2023-05-07: qty 90, 90d supply, fill #1

## 2023-02-02 ENCOUNTER — Other Ambulatory Visit (HOSPITAL_COMMUNITY): Payer: Self-pay

## 2023-03-23 ENCOUNTER — Encounter: Payer: Self-pay | Admitting: Urgent Care

## 2023-03-23 ENCOUNTER — Ambulatory Visit: Payer: Self-pay | Admitting: Internal Medicine

## 2023-03-23 ENCOUNTER — Other Ambulatory Visit (HOSPITAL_BASED_OUTPATIENT_CLINIC_OR_DEPARTMENT_OTHER): Payer: Self-pay

## 2023-03-23 ENCOUNTER — Ambulatory Visit (HOSPITAL_BASED_OUTPATIENT_CLINIC_OR_DEPARTMENT_OTHER)
Admission: RE | Admit: 2023-03-23 | Discharge: 2023-03-23 | Disposition: A | Discharge status: Home / Self Care | Payer: Commercial Managed Care - PPO | Source: Ambulatory Visit | Attending: Urgent Care | Admitting: Urgent Care

## 2023-03-23 ENCOUNTER — Ambulatory Visit: Payer: Commercial Managed Care - PPO | Admitting: Urgent Care

## 2023-03-23 VITALS — BP 123/83 | HR 93 | Temp 98.1°F | Wt 206.1 lb

## 2023-03-23 DIAGNOSIS — J22 Unspecified acute lower respiratory infection: Secondary | ICD-10-CM

## 2023-03-23 DIAGNOSIS — R052 Subacute cough: Secondary | ICD-10-CM | POA: Diagnosis not present

## 2023-03-23 DIAGNOSIS — R059 Cough, unspecified: Secondary | ICD-10-CM | POA: Diagnosis not present

## 2023-03-23 MED ORDER — PROMETHAZINE-DM 6.25-15 MG/5ML PO SYRP
5.0000 mL | ORAL_SOLUTION | Freq: Every evening | ORAL | 0 refills | Status: DC | PRN
  Filled 2023-03-23: qty 118, 23d supply, fill #0

## 2023-03-23 MED ORDER — AMOXICILLIN-POT CLAVULANATE 875-125 MG PO TABS
1.0000 | ORAL_TABLET | Freq: Two times a day (BID) | ORAL | 0 refills | Status: DC
  Filled 2023-03-23: qty 20, 10d supply, fill #0

## 2023-03-23 MED ORDER — PREDNISONE 20 MG PO TABS
20.0000 mg | ORAL_TABLET | Freq: Every day | ORAL | 0 refills | Status: AC
  Filled 2023-03-23: qty 5, 5d supply, fill #0
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
## 2023-03-23 NOTE — Patient Instructions (Signed)
 Please start taking Augmentin , an antibiotic, twice daily with food until gone. Take 1 tablet of prednisone  daily with breakfast every morning until gone.  Monitor your glucose while taking as it might cause a slight elevation. You may take the cough syrup 5 mL at bedtime.  This medication may make you drowsy.  Please go and get your chest x-ray performed today at [facility]. If normal, results will post to MyChart.  If abnormal, we will call you with results.  Please follow up with your primary care provider for any new or worsening concerns.

## 2023-03-23 NOTE — Telephone Encounter (Signed)
 Copied from CRM [id number]. Topic: Clinical - Red Word Triage >> Mar 23, 2023 10:17 AM Isabell A wrote: Red Word that prompted transfer to Nurse Triage: Cough with yellow/green mucus that's causing chest pain.  Chief Complaint: cough Symptoms: worsening cough  sometimes productive yellowish-green, chest pain/discomfort with coughing, stuffy nose, chest congestion, body aches, stuffy nose Frequency: since December and worsening Pertinent Negatives: Patient denies fever Disposition: [] ED /[] Urgent Care (no appt availability in office) / [x] Appointment(In office/virtual)/ []  [facility]/ [] Home Care/ [] Refused Recommended Disposition /[]  Mobile Bus/ []  Follow-up with PCP Additional Notes: pt has been taking medication provider gave him but it does not really help.    Reason for Disposition  SEVERE coughing spells (e.g., whooping sound after coughing, vomiting after coughing)  Answer Assessment - Initial Assessment Questions 1. ONSET: When did the cough begin?      December ongoing and getting worse 2. SEVERITY: How bad is the cough today?      Bad - coughing so bad chest hurts or sore 3. SPUTUM: Describe the color of your sputum (none, dry cough; clear, white, yellow, green)     Yellowish-green 4. HEMOPTYSIS: Are you coughing up any blood? If so ask: How much? (flecks, streaks, tablespoons, etc.)     no 5. DIFFICULTY BREATHING: Are you having difficulty breathing? If Yes, ask: How bad is it? (e.g., mild, moderate, severe)    - MILD: No SOB at rest, mild SOB with walking, speaks normally in sentences, can lie down, no retractions, pulse < 100.    - MODERATE: SOB at rest, SOB with minimal exertion and prefers to sit, cannot lie down flat, speaks in phrases, mild retractions, audible wheezing, pulse 100-120.    - SEVERE: Very SOB at rest, speaks in single words, struggling to breathe, sitting hunched forward, retractions, pulse > 120      unknown 6. FEVER: Do  you have a fever? If Yes, ask: What is your temperature, how was it measured, and when did it start?     no 7. CARDIAC HISTORY: Do you have any history of heart disease? (e.g., heart attack, congestive heart failure)      N/a 8. LUNG HISTORY: Do you have any history of lung disease?  (e.g., pulmonary embolus, asthma, emphysema)     N/a 9. PE RISK FACTORS: Do you have a history of blood clots? (or: recent major surgery, recent prolonged travel, bedridden)     N/a 10. OTHER SYMPTOMS: Do you have any other symptoms? (e.g., runny nose, wheezing, chest pain)       Some body aches, nose stuffy, runny nose, chest congestion 11. PREGNANCY: Is there any chance you are pregnant? When was your last menstrual period?       N/a 12. TRAVEL: Have you traveled out of the country in the last month? (e.g., travel history, exposures)       N/a  Protocols used: Cough - Acute Productive-A-AH

## 2023-03-27 ENCOUNTER — Other Ambulatory Visit: Payer: Self-pay

## 2023-03-27 ENCOUNTER — Other Ambulatory Visit (HOSPITAL_COMMUNITY): Payer: Self-pay

## 2023-04-03 ENCOUNTER — Other Ambulatory Visit: Payer: Self-pay | Admitting: Family

## 2023-04-03 ENCOUNTER — Other Ambulatory Visit (HOSPITAL_COMMUNITY): Payer: Self-pay

## 2023-04-05 ENCOUNTER — Other Ambulatory Visit (HOSPITAL_COMMUNITY): Payer: Self-pay

## 2023-04-06 ENCOUNTER — Other Ambulatory Visit (HOSPITAL_COMMUNITY): Payer: Self-pay

## 2023-04-09 ENCOUNTER — Other Ambulatory Visit (HOSPITAL_COMMUNITY): Payer: Self-pay

## 2023-04-09 ENCOUNTER — Telehealth: Payer: Self-pay

## 2023-04-09 ENCOUNTER — Other Ambulatory Visit: Payer: Self-pay

## 2023-04-09 MED ORDER — METFORMIN HCL 850 MG PO TABS
850.0000 mg | ORAL_TABLET | Freq: Three times a day (TID) | ORAL | 1 refills | Status: DC
  Filled 2023-04-09: qty 270, 90d supply, fill #0
  Filled 2023-08-06: qty 270, 90d supply, fill #1

## 2023-04-09 NOTE — Telephone Encounter (Signed)
 Rx sent  Copied from CRM [id number]. Topic: Clinical - Prescription Issue >> Apr 09, 2023  9:13 AM Adele Barthel wrote: Reason for CRM:   Patient is contacting clinic concerning his metFORMIN (GLUCOPHAGE) 850 MG tablet. He has been advised by [facility] that he has no remaining refills. I advised after reviewing chart that he has remaining refill. He repeated he has been advised there are no remaining refills at the pharmacy.   CB# [phone]

## 2023-04-09 NOTE — Telephone Encounter (Signed)
 Copied from CRM [id number]. Topic: Clinical - Prescription Issue >> Apr 09, 2023  9:13 AM Adele Barthel wrote: Reason for CRM:   Patient is contacting clinic concerning his metFORMIN (GLUCOPHAGE) 850 MG tablet. He has been advised by [facility] that he has no remaining refills. I advised after reviewing chart that he has remaining refill. He repeated he has been advised there are no remaining refills at the pharmacy.   CB# [phone]

## 2023-04-10 ENCOUNTER — Other Ambulatory Visit (HOSPITAL_COMMUNITY): Payer: Self-pay

## 2023-04-13 ENCOUNTER — Other Ambulatory Visit (HOSPITAL_COMMUNITY): Payer: Self-pay

## 2023-04-23 ENCOUNTER — Other Ambulatory Visit (HOSPITAL_BASED_OUTPATIENT_CLINIC_OR_DEPARTMENT_OTHER): Payer: Self-pay

## 2023-05-07 ENCOUNTER — Other Ambulatory Visit (HOSPITAL_COMMUNITY): Payer: Self-pay

## 2023-05-14 ENCOUNTER — Other Ambulatory Visit (HOSPITAL_COMMUNITY): Payer: Self-pay

## 2023-05-22 ENCOUNTER — Encounter: Payer: Self-pay | Admitting: Internal Medicine

## 2023-05-22 ENCOUNTER — Other Ambulatory Visit (HOSPITAL_COMMUNITY): Payer: Self-pay

## 2023-05-22 ENCOUNTER — Ambulatory Visit: Admitting: Internal Medicine

## 2023-05-22 VITALS — BP 126/80 | HR 79 | Temp 98.1°F | Resp 16 | Ht 66.0 in | Wt 202.0 lb

## 2023-05-22 DIAGNOSIS — M545 Low back pain, unspecified: Secondary | ICD-10-CM

## 2023-05-22 MED ORDER — PREDNISONE 10 MG PO TABS
ORAL_TABLET | ORAL | 0 refills | Status: DC
  Filled 2023-05-22: qty 18, 9d supply, fill #0

## 2023-05-22 MED ORDER — METHOCARBAMOL 500 MG PO TABS
500.0000 mg | ORAL_TABLET | Freq: Three times a day (TID) | ORAL | 0 refills | Status: DC | PRN
  Filled 2023-05-22: qty 15, 5d supply, fill #0
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
## 2023-05-22 NOTE — Patient Instructions (Addendum)
 Descanse en casa Compresas calientes 2 o 3 veces al dia Prednisone (deje de tomarla si el azucar sube mas de 200) Robaxin, relajante muscular, 3 veces al dia si lo necesita, causa suen~o, tenga cuidado Tylenol  500 mg OTC 2 tabletas cada  8 horas si lo necesita    Rest Warm compress twice daily Take prednisone as prescribed.  Stop if your blood sugars "over 200. Robaxin, a muscle relaxant, 3 times a day as needed.  Will cause drowsiness.  Be careful. Call if not gradually better. Tylenol      Please go to the front desk: Arrange for a follow-up next month

## 2023-05-22 NOTE — Assessment & Plan Note (Signed)
 Acute lumbalgia: As described above, on obvious pain by physical exam, no  radiculopathy on clinical grounds.  No red flag symptoms. Will treat conservatively with Robaxin, rest, prednisone (stop if CBGs more than 200), Tylenol.  Work note provided. Call if not gradually better.

## 2023-06-25 ENCOUNTER — Other Ambulatory Visit: Payer: Self-pay

## 2023-06-25 ENCOUNTER — Other Ambulatory Visit: Payer: Self-pay | Admitting: Internal Medicine

## 2023-06-25 ENCOUNTER — Other Ambulatory Visit (HOSPITAL_COMMUNITY): Payer: Self-pay

## 2023-06-25 MED ORDER — ATORVASTATIN CALCIUM 20 MG PO TABS
20.0000 mg | ORAL_TABLET | Freq: Every day | ORAL | 0 refills | Status: DC
Start: 2023-06-25 — End: 2023-07-26
  Filled 2023-06-25: qty 30, 30d supply, fill #0

## 2023-06-25 MED ORDER — PIOGLITAZONE HCL 45 MG PO TABS
45.0000 mg | ORAL_TABLET | Freq: Every day | ORAL | 0 refills | Status: DC
  Filled 2023-06-25: qty 30, 30d supply, fill #0

## 2023-06-25 MED ORDER — HYDROCHLOROTHIAZIDE 25 MG PO TABS
12.5000 mg | ORAL_TABLET | Freq: Every day | ORAL | 0 refills | Status: DC
  Filled 2023-06-25: qty 15, 30d supply, fill #0

## 2023-07-05 ENCOUNTER — Other Ambulatory Visit (HOSPITAL_COMMUNITY): Payer: Self-pay

## 2023-07-05 ENCOUNTER — Other Ambulatory Visit: Payer: Self-pay | Admitting: Internal Medicine

## 2023-07-05 MED ORDER — CARVEDILOL 6.25 MG PO TABS
6.2500 mg | ORAL_TABLET | Freq: Two times a day (BID) | ORAL | 0 refills | Status: DC
  Filled 2023-07-05: qty 60, 30d supply, fill #0

## 2023-07-11 ENCOUNTER — Other Ambulatory Visit: Payer: Self-pay | Admitting: Internal Medicine

## 2023-07-11 ENCOUNTER — Other Ambulatory Visit (HOSPITAL_COMMUNITY): Payer: Self-pay

## 2023-07-11 MED ORDER — SITAGLIPTIN PHOSPHATE 100 MG PO TABS
100.0000 mg | ORAL_TABLET | Freq: Every day | ORAL | 0 refills | Status: DC
  Filled 2023-07-11: qty 90, 90d supply, fill #0

## 2023-07-17 ENCOUNTER — Encounter: Admitting: Internal Medicine

## 2023-07-25 ENCOUNTER — Ambulatory Visit (INDEPENDENT_AMBULATORY_CARE_PROVIDER_SITE_OTHER): Admitting: Internal Medicine

## 2023-07-25 ENCOUNTER — Encounter: Payer: Self-pay | Admitting: Internal Medicine

## 2023-07-25 VITALS — BP 126/80 | HR 76 | Temp 98.3°F | Resp 16 | Ht 66.0 in | Wt 206.1 lb

## 2023-07-25 DIAGNOSIS — I10 Essential (primary) hypertension: Secondary | ICD-10-CM

## 2023-07-25 DIAGNOSIS — E785 Hyperlipidemia, unspecified: Secondary | ICD-10-CM

## 2023-07-25 DIAGNOSIS — E119 Type 2 diabetes mellitus without complications: Secondary | ICD-10-CM

## 2023-07-25 DIAGNOSIS — Z7984 Long term (current) use of oral hypoglycemic drugs: Secondary | ICD-10-CM | POA: Diagnosis not present

## 2023-07-25 DIAGNOSIS — Z0100 Encounter for examination of eyes and vision without abnormal findings: Secondary | ICD-10-CM | POA: Diagnosis not present

## 2023-07-25 NOTE — Patient Instructions (Signed)
 Diabetes: Chequear el azucar regularmente En la man~ana antes de el desayuno debe ser entre 70 y 130 Si la chequea 2 horas despues de una comida debe ser menos de 180  You can check your sugars at different times  - early in AM fasting  ( blood sugar goal 70-130) - 2 hours after a meal (blood sugar goal less than 180)        GO TO THE LAB :  Get the blood work   Your results will be posted on MyChart with my comments  Next office visit for a physical exam by November 2025 Please make an appointment before you leave today
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
## 2023-07-26 ENCOUNTER — Other Ambulatory Visit (HOSPITAL_COMMUNITY): Payer: Self-pay

## 2023-07-26 ENCOUNTER — Other Ambulatory Visit: Payer: Self-pay

## 2023-07-26 ENCOUNTER — Other Ambulatory Visit: Payer: Self-pay | Admitting: Internal Medicine

## 2023-07-26 LAB — BASIC METABOLIC PANEL WITH GFR
BUN/Creatinine Ratio: 20 (calc) (ref 6–22)
BUN: 13 mg/dL (ref 7–25)
CO2: 29 mmol/L (ref 20–32)
Calcium: 10.7 mg/dL — ABNORMAL HIGH (ref 8.6–10.3)
Chloride: 100 mmol/L (ref 98–110)
Creat: 0.66 mg/dL — ABNORMAL LOW (ref 0.70–1.35)
Glucose, Bld: 88 mg/dL (ref 65–99)
Potassium: 4.2 mmol/L (ref 3.5–5.3)
Sodium: 139 mmol/L (ref 135–146)
eGFR: 107 mL/min/{1.73_m2} (ref >=60)

## 2023-07-26 LAB — HEMOGLOBIN A1C
Hgb A1c MFr Bld: 6.9 % — ABNORMAL HIGH (ref <5.7)
Mean Plasma Glucose: 151 mg/dL
eAG (mmol/L): 8.4 mmol/L

## 2023-07-26 LAB — MICROALBUMIN / CREATININE URINE RATIO
Creatinine, Urine: 26 mg/dL (ref 20–320)
Microalb Creat Ratio: 8 mg/g{creat} (ref <30)
Microalb, Ur: 0.2 mg/dL

## 2023-07-26 MED ORDER — ATORVASTATIN CALCIUM 20 MG PO TABS
20.0000 mg | ORAL_TABLET | Freq: Every day | ORAL | 1 refills | Status: DC
  Filled 2023-07-26: qty 90, 90d supply, fill #0
  Filled 2023-10-26: qty 90, 90d supply, fill #1

## 2023-07-26 MED ORDER — HYDROCHLOROTHIAZIDE 25 MG PO TABS
12.5000 mg | ORAL_TABLET | Freq: Every day | ORAL | 1 refills | Status: DC
  Filled 2023-07-26: qty 45, 90d supply, fill #0
  Filled 2023-10-22: qty 45, 90d supply, fill #1

## 2023-07-26 NOTE — Assessment & Plan Note (Signed)
 DM: On metformin , pioglitazone  and Januvia .  Ambulatory CBGs ~ 120. Feet exam negative Plan: A1c, micro, further adegbenro results.  CBG goals provided. HTN: BP looks good, recommend to check from time to time, continue amlodipine , HCTZ, carvedilol .  Check BMP Hyperlipidemia: LDL 76, continue atorvastatin  Acute lumbalgia: See LOV, resolved few days after the visit. Vaccine advised: Declined PNM 20 today, will discuss on RTC.  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 RTC November 2025 CPX

## 2023-07-26 NOTE — Telephone Encounter (Signed)
 A1c higher at 6.9 yesterday, okay to refill actos ?

## 2023-07-27 ENCOUNTER — Other Ambulatory Visit (HOSPITAL_COMMUNITY): Payer: Self-pay

## 2023-07-27 ENCOUNTER — Ambulatory Visit: Payer: Self-pay | Admitting: Internal Medicine

## 2023-07-27 MED ORDER — PIOGLITAZONE HCL 45 MG PO TABS
45.0000 mg | ORAL_TABLET | Freq: Every day | ORAL | 1 refills | Status: DC
  Filled 2023-07-27: qty 90, 90d supply, fill #0
  Filled 2023-10-26: qty 90, 90d supply, fill #1

## 2023-07-27 NOTE — Telephone Encounter (Signed)
 Ok to RF, no changes made

## 2023-08-06 ENCOUNTER — Other Ambulatory Visit: Payer: Self-pay

## 2023-08-06 ENCOUNTER — Other Ambulatory Visit: Payer: Self-pay | Admitting: Internal Medicine

## 2023-08-06 ENCOUNTER — Other Ambulatory Visit (HOSPITAL_COMMUNITY): Payer: Self-pay

## 2023-08-06 MED ORDER — CARVEDILOL 6.25 MG PO TABS
6.2500 mg | ORAL_TABLET | Freq: Two times a day (BID) | ORAL | 0 refills | Status: DC
  Filled 2023-08-06: qty 180, 90d supply, fill #0

## 2023-08-06 MED ORDER — AMLODIPINE BESYLATE 5 MG PO TABS
5.0000 mg | ORAL_TABLET | Freq: Every day | ORAL | 0 refills | Status: DC
  Filled 2023-08-06: qty 90, 90d supply, fill #0

## 2023-10-16 ENCOUNTER — Other Ambulatory Visit: Payer: Self-pay | Admitting: Internal Medicine

## 2023-10-16 ENCOUNTER — Other Ambulatory Visit (HOSPITAL_COMMUNITY): Payer: Self-pay

## 2023-10-16 MED ORDER — SITAGLIPTIN PHOSPHATE 100 MG PO TABS
100.0000 mg | ORAL_TABLET | Freq: Every day | ORAL | 1 refills | Status: AC
  Filled 2023-10-16: qty 90, 90d supply, fill #0
  Filled 2024-01-18: qty 90, 90d supply, fill #1

## 2023-10-22 ENCOUNTER — Other Ambulatory Visit (HOSPITAL_COMMUNITY): Payer: Self-pay

## 2023-10-26 ENCOUNTER — Other Ambulatory Visit (HOSPITAL_COMMUNITY): Payer: Self-pay

## 2023-10-27 ENCOUNTER — Other Ambulatory Visit (HOSPITAL_COMMUNITY): Payer: Self-pay

## 2023-11-06 ENCOUNTER — Other Ambulatory Visit (HOSPITAL_COMMUNITY): Payer: Self-pay

## 2023-11-06 ENCOUNTER — Telehealth: Payer: Self-pay | Admitting: Internal Medicine

## 2023-11-06 MED ORDER — AMLODIPINE BESYLATE 5 MG PO TABS
5.0000 mg | ORAL_TABLET | Freq: Every day | ORAL | 0 refills | Status: DC
  Filled 2023-11-06: qty 90, 90d supply, fill #0

## 2023-11-06 MED ORDER — CARVEDILOL 6.25 MG PO TABS
6.2500 mg | ORAL_TABLET | Freq: Two times a day (BID) | ORAL | 0 refills | Status: DC
  Filled 2023-11-06: qty 180, 90d supply, fill #0

## 2023-11-06 NOTE — Telephone Encounter (Signed)
 Levorn- can you try calling Pt, he needs CPX with Dr. Amon in November (last was 12/19/22) please. (Spanish only)

## 2023-11-07 NOTE — Telephone Encounter (Signed)
 Called patient but no answer, left voice mail for patient to call back.

## 2023-11-12 NOTE — Telephone Encounter (Signed)
 Called patient to schedule appointment, he will like to call us  back to schedule. He was advised this was needed for after 12/29/23

## 2023-11-26 ENCOUNTER — Other Ambulatory Visit (HOSPITAL_COMMUNITY): Payer: Self-pay

## 2023-11-26 ENCOUNTER — Other Ambulatory Visit: Payer: Self-pay | Admitting: Internal Medicine

## 2023-11-26 MED ORDER — HYDROCORTISONE 2.5 % EX CREA
TOPICAL_CREAM | Freq: Two times a day (BID) | CUTANEOUS | 1 refills | Status: AC
  Filled 2023-11-26: qty 30, 15d supply, fill #0

## 2024-01-18 ENCOUNTER — Other Ambulatory Visit (HOSPITAL_COMMUNITY): Payer: Self-pay

## 2024-01-23 ENCOUNTER — Other Ambulatory Visit: Payer: Self-pay | Admitting: Internal Medicine

## 2024-01-23 ENCOUNTER — Other Ambulatory Visit (HOSPITAL_COMMUNITY): Payer: Self-pay

## 2024-01-23 MED ORDER — HYDROCHLOROTHIAZIDE 25 MG PO TABS
12.5000 mg | ORAL_TABLET | Freq: Every day | ORAL | 0 refills | Status: AC
  Filled 2024-01-23: qty 45, 90d supply, fill #0

## 2024-01-23 MED ORDER — ATORVASTATIN CALCIUM 20 MG PO TABS
20.0000 mg | ORAL_TABLET | Freq: Every day | ORAL | 0 refills | Status: AC
  Filled 2024-01-23: qty 90, 90d supply, fill #0

## 2024-01-23 MED ORDER — METFORMIN HCL 850 MG PO TABS
850.0000 mg | ORAL_TABLET | Freq: Three times a day (TID) | ORAL | 0 refills | Status: AC
  Filled 2024-01-23: qty 270, 90d supply, fill #0

## 2024-01-23 MED ORDER — PIOGLITAZONE HCL 45 MG PO TABS
45.0000 mg | ORAL_TABLET | Freq: Every day | ORAL | 0 refills | Status: AC
  Filled 2024-01-23: qty 90, 90d supply, fill #0

## 2024-02-08 ENCOUNTER — Encounter: Payer: Self-pay | Admitting: *Deleted

## 2024-02-08 ENCOUNTER — Other Ambulatory Visit: Payer: Self-pay | Admitting: Internal Medicine

## 2024-02-08 ENCOUNTER — Other Ambulatory Visit (HOSPITAL_COMMUNITY): Payer: Self-pay

## 2024-02-08 MED ORDER — AMLODIPINE BESYLATE 5 MG PO TABS
5.0000 mg | ORAL_TABLET | Freq: Every day | ORAL | 0 refills | Status: AC
  Filled 2024-02-08 – 2024-02-09 (×2): qty 90, 90d supply, fill #0

## 2024-02-08 MED ORDER — CARVEDILOL 6.25 MG PO TABS
6.2500 mg | ORAL_TABLET | Freq: Two times a day (BID) | ORAL | 0 refills | Status: AC
  Filled 2024-02-08 – 2024-02-09 (×2): qty 180, 90d supply, fill #0

## 2024-02-09 ENCOUNTER — Other Ambulatory Visit (HOSPITAL_COMMUNITY): Payer: Self-pay

## 2024-02-11 ENCOUNTER — Other Ambulatory Visit: Payer: Self-pay

## 2024-04-25 ENCOUNTER — Ambulatory Visit: Admitting: Internal Medicine
